# Patient Record
Sex: Female | Born: 1970
Health system: Southern US, Community
[De-identification: ages and names within clinical notes are randomized; demographics above are authoritative.]

## PROBLEM LIST (undated history)

## (undated) DIAGNOSIS — E119 Type 2 diabetes mellitus without complications: Secondary | ICD-10-CM

## (undated) DIAGNOSIS — F419 Anxiety disorder, unspecified: Secondary | ICD-10-CM

## (undated) DIAGNOSIS — Z789 Other specified health status: Secondary | ICD-10-CM

## (undated) DIAGNOSIS — G35 Multiple sclerosis: Secondary | ICD-10-CM

## (undated) DIAGNOSIS — I10 Essential (primary) hypertension: Secondary | ICD-10-CM

## (undated) HISTORY — PX: APPENDECTOMY: SHX54

## (undated) HISTORY — DX: Type 2 diabetes mellitus without complications: E11.9

---

## 1999-06-06 ENCOUNTER — Other Ambulatory Visit: Admission: RE | Admit: 1999-06-06 | Discharge: 1999-06-06 | Payer: Self-pay | Admitting: *Deleted

## 2001-12-21 ENCOUNTER — Encounter: Admission: RE | Admit: 2001-12-21 | Discharge: 2002-03-21 | Payer: Self-pay | Admitting: Internal Medicine

## 2002-05-12 ENCOUNTER — Encounter: Admission: RE | Admit: 2002-05-12 | Discharge: 2002-08-10 | Payer: Self-pay | Admitting: Internal Medicine

## 2002-12-05 ENCOUNTER — Other Ambulatory Visit: Admission: RE | Admit: 2002-12-05 | Discharge: 2002-12-05 | Payer: Self-pay | Admitting: Obstetrics and Gynecology

## 2004-02-08 ENCOUNTER — Other Ambulatory Visit: Admission: RE | Admit: 2004-02-08 | Discharge: 2004-02-08 | Payer: Self-pay | Admitting: Obstetrics and Gynecology

## 2005-07-30 ENCOUNTER — Other Ambulatory Visit: Admission: RE | Admit: 2005-07-30 | Discharge: 2005-07-30 | Payer: Self-pay | Admitting: Obstetrics and Gynecology

## 2006-08-07 ENCOUNTER — Other Ambulatory Visit: Admission: RE | Admit: 2006-08-07 | Discharge: 2006-08-07 | Payer: Self-pay | Admitting: Obstetrics & Gynecology

## 2007-02-04 ENCOUNTER — Other Ambulatory Visit: Admission: RE | Admit: 2007-02-04 | Discharge: 2007-02-04 | Payer: Self-pay | Admitting: Obstetrics and Gynecology

## 2007-06-23 ENCOUNTER — Inpatient Hospital Stay (HOSPITAL_COMMUNITY): Admission: EM | Admit: 2007-06-23 | Discharge: 2007-06-27 | Payer: Self-pay | Admitting: Emergency Medicine

## 2007-06-23 ENCOUNTER — Encounter (INDEPENDENT_AMBULATORY_CARE_PROVIDER_SITE_OTHER): Payer: Self-pay | Admitting: Surgery

## 2009-01-31 ENCOUNTER — Inpatient Hospital Stay (HOSPITAL_COMMUNITY): Admission: AD | Admit: 2009-01-31 | Discharge: 2009-02-06 | Payer: Self-pay | Admitting: Obstetrics and Gynecology

## 2009-02-02 ENCOUNTER — Encounter (INDEPENDENT_AMBULATORY_CARE_PROVIDER_SITE_OTHER): Payer: Self-pay | Admitting: Obstetrics and Gynecology

## 2010-05-09 ENCOUNTER — Encounter: Admission: RE | Admit: 2010-05-09 | Discharge: 2010-05-09 | Payer: Self-pay | Admitting: Internal Medicine

## 2011-01-07 LAB — CBC
HCT: 38.8 % (ref 36.0–46.0)
Hemoglobin: 10.3 g/dL — ABNORMAL LOW (ref 12.0–15.0)
MCHC: 34.8 g/dL (ref 30.0–36.0)
MCV: 84.5 fL (ref 78.0–100.0)
RBC: 3.43 MIL/uL — ABNORMAL LOW (ref 3.87–5.11)
RDW: 14.2 % (ref 11.5–15.5)
WBC: 5.8 10*3/uL (ref 4.0–10.5)

## 2011-02-11 NOTE — Op Note (Signed)
NAMEROSARIA, KUBIN            ACCOUNT NO.:  [id number]   MEDICAL RECORD NO.:  [id number]          PATIENT TYPE:  INP   LOCATION:  1329                         FACILITY:  [facility]   PHYSICIAN:  Thornton Park. Daphine Deutscher, MD  DATE OF BIRTH:  [birth year]   DATE OF PROCEDURE:  06/23/2007  DATE OF DISCHARGE:                               OPERATIVE REPORT   PREOPERATIVE DIAGNOSIS:  Ruptured appendix, peritonitis.   POSTOPERATIVE DIAGNOSIS:  Ruptured appendix, peritonitis.   PROCEDURE:  Laparoscopic appendectomy.   SURGEON:  Thornton Park. Daphine Deutscher, MD   ANESTHESIA:  General endotracheal.   DESCRIPTION OF PROCEDURE:  Ms. Mankowski was taken to room 1 at [facility] on June 23, 2007 and given general anesthesia.  Preoperatively  she received 3 grams of unison.  After the abdomen was prepped with  Techni-Care and draped sterilely, I made a longitudinal incision down  into her very obese umbilicus and entered the abdomen without  difficulty.  Hasson cannula was placed and then a 5 mm was placed the  right upper quadrant and 11 obliquely in the left lower quadrant.   I was struck by the amount of yellow fluid that was socked into the  pelvis and along the right gutter.  I sucked this out with an irrigation  suction device and then used blunt dissection to free up the tip.  This  large phlegmon which appeared to be a perforated appendix that only  partially walled and again with more pus than feculent contamination.  I  did not really see much in the way of feculent contamination.  I was  able to isolate the base the appendix and stapled across with it with  two applications of a vascular stapler and then came to mesentery of the  appendix putting clips on the artery of the appendix and using a  harmonic scalpel to divide it.  Placed in a bag brought to the  umbilicus.  I irrigated the area well and no bleeding was seen.  Everything looked to be in order.  I irrigated the pelvis and removed  the  irrigant and I removed the irrigant from the right upper quadrant.   Then I repaired the umbilical defect with a 30 degree scope under direct  vision with a laparoscope.  Two sutures of 0 Vicryl.  I then injected  all the ports with 0.5% Marcaine closed the skin with 4-0 Vicryl,  Benzoin and Steri-Strips.  The patient tolerated procedure well, was  taken to recovery in room satisfactory condition.      Thornton Park Daphine Deutscher, MD  Electronically Signed    MBM/MEDQ  D:  06/23/2007  T:  06/24/2007  Job:  [id number]   cc:   M. Leda Quail, MD  Fax: [phone]

## 2011-02-11 NOTE — Op Note (Signed)
[name], Ann [name] NO.:  [id number]   MEDICAL RECORD NO.:  [id number]           PATIENT TYPE:   LOCATION:                                 FACILITY:   PHYSICIAN:  Ann [name], M.D.   DATE OF BIRTH:  August 08, 1971   DATE OF PROCEDURE:  02/02/2009  DATE OF DISCHARGE:                               OPERATIVE REPORT   PREOPERATIVE DIAGNOSES:  1. Intrauterine gestation at 40 plus 6 weeks.  2. Fetal intolerance of labor.  3. Positive group B strep status.  4. Large for gestational age.   POSTOPERATIVE DIAGNOSES:  1. Intrauterine gestation at 44 plus 6 weeks.  2. Fetal intolerance of labor.  3. Positive group B strep status.  4. Fetal macrosomia.  5. Umbilical hernia.   PROCEDURE:  Primary low-segment transverse cesarean section with closure  of umbilical hernia.   SURGEON:  Ann Lobo, MD   ASSISTANT:  Gretchen Short, PA-C   ANESTHESIA:  Epidural.   IV FLUIDS:  2300 mL Ringer's lactate.   ESTIMATED BLOOD LOSS:  700 mL.   URINE OUTPUT:  200 mL.   COMPLICATIONS:  None.   INDICATIONS FOR PROCEDURE:  The patient is a 40 year old gravida 1, para  0, African American female who at term was noted to have a large for  gestational age infant weighing 4054 g on office ultrasound.  The  patient had an elevated 1-hour glucose tolerance test but a normal 3-  hour glucose tolerance test.  The patient's BMI, during the pregnancy,  was noted to be 46.  The patient had a positive group B strep status.  Her cervix was noted to be close thick and with the vertex high.  A  discussion was held with the patient regarding options for care and the  patient chose to proceed with an induction of labor after risks,  benefits, and alternatives were reviewed.  The patient was aware that  her cervix was unfavorable and that she did have a risk of cesarean  section and a plan was made to proceed.   The patient was admitted on the evening of Jan 31, 2009, at which time  she  received low-dose Pitocin overnight for cervical ripening.  The  following day, the patient did not exhibit any cervical change and the  Pitocin was therefore discontinued and the patient went on to receive  doses of Cytotec in the evening of Feb 01, 2009.  The fetal heart rate  tracing during this time remained reactive and reassuring until early on  the morning of Feb 02, 2009.   The patient had her Pitocin started early on Feb 02, 2009, and when I  arrived to assume care of the patient, I diagnosed tachy systole and a  nonreactive fetal heart rate tracing with decreased beat-to-beat  variability and mild variables.  The Pitocin was discontinued at this  time and an IV fluid bolus was given for resuscitation.  The fetal heart  rate tracing became reassuring, and the Pitocin was therefore  reinitiated.  The patient became uncomfortable and requesting pain  medication and at this time,  an epidural was given in order to avoid  administering systemic narcotics.  The Pitocin was once again restarted,  and the fetus again developed decreased beat-to-beat variability and  late decelerations along with variable decelerations and an episode of  bradycardia.  The Pitocin was discontinued.  The patient's cervical exam  was reassessed.  There was absolutely no cervical dilation.  The fetal  heart rate tracing did become reactive when the Pitocin was  discontinued, and the patient was given a diagnosis of fetal intolerance  of labor and a large for gestational age infant.   The patient had been receiving penicillin throughout her labor induction  for her positive group B strep status.  There was no sign of  chorioamnionitis at this time.   A discussion was held with the patient regarding her diagnosis and a  recommendation was made to proceed with a primary cesarean section using  a vertical upper abdominal paramedian incision due to her large  panniculus.  Risks, benefits, and alternatives were  reviewed with the  patient who wished to proceed.   FINDINGS:  A viable female was delivered at 73.  Apgars were not noted  in the operating room.  The amniotic fluid was noted to be clear, and  there was a large amount of this.  There was a body cord appreciated.  When the baby was delivered, the baby did appear to be dusky and with  some decreased tone.  The cord pH was later noted to be 7.17 and the  weight was 10 pounds 4.6 ounces.   There were bilateral peritubal adhesions and uterine fundal adhesions to  the omentum.  There was also evidence of an umbilical hernia with no  bowel or omentum herniating into this area.   SPECIMENS:  The placenta was sent to Pathology.   DESCRIPTION OF PROCEDURE:  The patient was escorted from her labor and  delivery suite down to the operating room.  She did receive Ancef for IV  antibiotic prophylaxis.  The patient's epidural was dosed for surgical  anesthesia.   The abdomen was sterilely prepped and draped.  A Foley catheter had been  previously placed.   A right upper abdominal paramedian incision was performed sharply with a  scalpel.  The dissection was carried down to the fascia using monopolar  cautery and a scalpel.  There was significant tissue edema in this  region.  The fascia was then incised vertically with a scalpel.  Entry  into the peritoneal cavity was performed at this time.  The peritoneal  incision was extended cranially and caudally using scissors.   The Alexis retractor was used.  The lower uterine segment was exposed.  A bladder flap was sharply created.  A transverse lower uterine segment  incision was then created with a scalpel.  A hand was inserted through  the uterine incision.  The umbilical cord and the vertex delivered  together with the assistance of uterine fundal pressure.  The remainder  of the newborn was delivered.  The nares and mouth were suctioned.  The  newborn was carried over to the awaiting  pediatricians.  The baby did  require some suctioning.  Ultimately, the baby was transferred to the  neonatal intensive care unit for grunting and retracting.   The placenta was manually extracted at this time after a cord pH and  cord blood were obtained.  The uterus was wiped clean with a moistened  lap pad.  All remaining products of conception  were removed.  The  uterine incision was then closed with a double layer closure of #1  chromic.  The first was a running locked layer and the second was an  imbricating layer.  Hemostasis was noted to be good.  Adhesions between  the omentum and the fallopian tubes and the uterine fundus were lysed  with monopolar cautery.   The abdomen was closed at this time.  It was closed with a mass closure  of double-looped zero PDS.  During the closure of the abdominal wall,  the hernial defect was closed as this was basically just lateral and  very close to the fascial closure itself.   The subcutaneous layer was irrigated and suctioned and made hemostatic  with monopolar cautery.  It was closed with interrupted sutures of 2-0  plain gut suture.  The skin was closed with staples and a sterile  bandage was placed over the incision.   This concluded the patient's procedure.  There were no complications.  All needle, instrument, and sponge counts were correct.      Ann [name], M.D.  Electronically Signed     BES/MEDQ  D:  02/02/2009  T:  02/03/2009  Job:  [id number]

## 2011-02-14 NOTE — Discharge Summary (Signed)
NAMEBRITTYN, Ann [name]            ACCOUNT NO.:  [id number]   MEDICAL RECORD NO.:  [id number]          PATIENT TYPE:  INP   LOCATION:  9302                          FACILITY:  [facility]   PHYSICIAN:  Carrington Clamp, M.D. DATE OF BIRTH:  1970/10/29   DATE OF ADMISSION:  01/31/2009  DATE OF DISCHARGE:  02/06/2009                               DISCHARGE SUMMARY   FINAL DIAGNOSES:  Intrauterine gestation at 39-6/7 weeks' gestation,  induction of labor, fetal intolerance of labor, positive group B  streptococcus, fetal macrosomia, and umbilical hernia.   PROCEDURE:  Primary low transverse cesarean section with closure of an  umbilical hernia.   SURGEON:  Randye Lobo, MD   ASSISTANT:  Garrison Columbus, PA-C   COMPLICATIONS:  None.   This 40 year old G1, P0 presents at term for an induction.  The patient  was noted to have a large for gestational age infant.  On office  ultrasound, the patient had abnormal 1-hour but passed a 3-hour glucose  tolerance test.  The patient was also known to be positive group B strep  status.  The patient does have multiple sclerosis.  She has also  advanced maternal age and did have her quad screen that was within  normal limits, but did not have amniocentesis.  The patient's cervix at  term was still closed and thick and vertex was high.  The patient was  admitted at this time where she received low-dose Pitocin overnight for  cervical ripening.  By the next day, there was still no cervical change.  Pitocin was discontinued, and the patient received Cytotec on the  evening of Feb 01, 2009.  Fetal heart tones during this time were  reactive.  The patient's Pitocin was started on the morning of Feb 02, 2009.  Some tachycardia and nonreactive fetal heart tracing was noted  with decreased beat-to-beat variability.  At this point, Pitocin was  discontinued, IV fluids were started, and the heart rate tracing became  reassuring.  Pitocin was able to be restarted at  this point, and at this  point, the fetus again started to develop some beat-to-beat variability  and late decelerations.  At this point, Pitocin was discontinued.  Cervix was checked, there was no cervical dilation.  At this point, a  discussion was held with the patient regarding the fetal intolerance of  labor and decision was made to proceed with a cesarean section.  The  patient had been receiving penicillin throughout her labor induction for  positive group B strep status.  The patient at this point was taken to  the operating room on Feb 02, 2009, by Dr. Conley Simmonds where a primary  low segment transverse cesarean section was performed with the delivery  of a 10-pound 4.6-ounce female infant.  Upon delivery, there was a body  cord noted.  Baby did appear dusky with some decreased tone.  Cord pH  was noted to be 7.17.  There also were bilateral peritubal adhesions and  uterine fundal adhesions to the omentum, and also advancement of  umbilical hernia, but no bowel herniating into this area.  Umbilical  hernia was repaired at this point.  There were no complications.  The  patient was taken to the recovery room in good condition.   The patient's postoperative course was benign without any significant  fevers.  Baby was in the NICU on room air and doing well.  The patient  was kept in the hospital until postoperative day #4.  She was sent home  on a regular diet, told to decrease her activities, told to continue her  prenatal vitamins, was given a prescription for Percocet 1-2 every 4-6  hours as needed for her pain, told she could use over-the-counter  ibuprofen up to 600 mg every 6 hours as needed for pain, was to follow  up in our office in 2 weeks, and then also for her staple removal on  Friday.  Instructions and precautions were reviewed with the patient.   Labs on discharge, the patient had a hemoglobin of 10.3, white blood  cell count of 6.0, platelets of 129,000.   On the  surgical pathology of the placenta, there was acute funisitis and  early acute chorioamnionitis noted.      Leilani Able, P.A.-C.      Carrington Clamp, M.D.  Electronically Signed    MB/MEDQ  D:  02/21/2009  T:  02/22/2009  Job:  [id number]

## 2011-02-14 NOTE — Discharge Summary (Signed)
[name], [name]            ACCOUNT NO.:  [id number]   MEDICAL RECORD NO.:  [id number]          PATIENT TYPE:  INP   LOCATION:  1329                         [facility]ACILITY:  [facility]   PHYSICIAN:  Thornton Park. Daphine Deutscher, MD  DATE OF BIRTH:  [birth year]   DATE OF ADMISSION:  06/23/2007  DATE OF DISCHARGE:  06/27/2007                               DISCHARGE SUMMARY   ADMITTING DIAGNOSIS:  Appendicitis with ruptured appendix and phlegmon.   COURSE IN THE HOSPITAL:  This patient came over from Dr. Mariane Masters  office, who had a CT scan at [facility] that was consistent with a  ruptured appendix and phlegmon.  She is a 40 year old obese black  female; and after getting informed consent regarding laparoscopic as  well as open appendectomy, she was taken to the operating room and  underwent a laparoscopic appendectomy.  She did well postoperative  initially, although she has having a fair amount of pain in her  umbilicus and had minimal mobility.  She was not coughing or deep  breathing on the first postoperative day.   On the second postoperative day she had a temperature of 101.9,  which  postponed her discharge.  We kept her on IV Unasyn and observed her.  She finally got better and was ready for discharge.   On postoperative day #4 her white count was down to 8000 and she was  afebrile.  She was made ready to go home and was given Augmentin 875 and  Vicodin.  We have asked her to come back to the office and see Dr.  Daphine Deutscher in 2-3 weeks.   FINAL DIAGNOSES:  Acute suppurative appendicitis with fibrous subliminal  obliteration at the tip.      Thornton Park Daphine Deutscher, MD  Electronically Signed     MBM/MEDQ  D:  07/17/2007  T:  07/19/2007  Job:  [id number]

## 2011-07-10 LAB — CBC
HCT: 35.7 — ABNORMAL LOW
Hemoglobin: 10.8 — ABNORMAL LOW
Hemoglobin: 11.4 — ABNORMAL LOW
Hemoglobin: 12
Hemoglobin: 14.2
MCHC: 34.3
MCHC: 34.6
MCHC: 35
MCV: 80.4
MCV: 80.5
MCV: 80.9
Platelets: 222
Platelets: 240
Platelets: 245
RBC: 3.86 — ABNORMAL LOW
RBC: 4.08
RBC: 4.41
RDW: 13.4
RDW: 13.6
RDW: 14
RDW: 14.1 — ABNORMAL HIGH
WBC: 12.4 — ABNORMAL HIGH
WBC: 15.2 — ABNORMAL HIGH

## 2011-07-10 LAB — BASIC METABOLIC PANEL
CO2: 25
Calcium: 8 — ABNORMAL LOW
Calcium: 9
Chloride: 100
Chloride: 103
Chloride: 99
Creatinine, Ser: 0.97
GFR calc Af Amer: >60
GFR calc Af Amer: >60
GFR calc non Af Amer: 59 — ABNORMAL LOW
GFR calc non Af Amer: >60
GFR calc non Af Amer: >60
Glucose, Bld: 104 — ABNORMAL HIGH
Glucose, Bld: 106 — ABNORMAL HIGH
Glucose, Bld: 112 — ABNORMAL HIGH
Glucose, Bld: 118 — ABNORMAL HIGH
Glucose, Bld: 92
Potassium: 3.6
Potassium: 3.6
Sodium: 134 — ABNORMAL LOW
Sodium: 137

## 2011-07-10 LAB — DIFFERENTIAL
Band Neutrophils: 0
Basophils Relative: 0
Basophils Relative: 0
Eosinophils Relative: 0
Eosinophils Relative: 1
Lymphocytes Relative: 6 — ABNORMAL LOW
Lymphs Abs: 0.8
Monocytes Absolute: 0.6
Monocytes Relative: 0 — ABNORMAL LOW
Monocytes Relative: 5
Myelocytes: 0
Neutro Abs: 11 — ABNORMAL HIGH
Promyelocytes Absolute: 0

## 2011-09-10 ENCOUNTER — Other Ambulatory Visit: Payer: Self-pay | Admitting: Obstetrics and Gynecology

## 2011-09-15 ENCOUNTER — Other Ambulatory Visit: Payer: Self-pay | Admitting: Obstetrics and Gynecology

## 2011-09-15 DIAGNOSIS — R928 Other abnormal and inconclusive findings on diagnostic imaging of breast: Secondary | ICD-10-CM

## 2011-09-29 ENCOUNTER — Ambulatory Visit
Admission: RE | Admit: 2011-09-29 | Discharge: 2011-09-29 | Disposition: A | Discharge status: Home / Self Care | Payer: 59 | Source: Ambulatory Visit | Attending: Obstetrics and Gynecology | Admitting: Obstetrics and Gynecology

## 2011-09-29 DIAGNOSIS — R928 Other abnormal and inconclusive findings on diagnostic imaging of breast: Secondary | ICD-10-CM

## 2013-12-16 ENCOUNTER — Other Ambulatory Visit: Payer: Self-pay | Admitting: Obstetrics and Gynecology

## 2015-07-19 ENCOUNTER — Encounter (HOSPITAL_COMMUNITY): Payer: Self-pay | Admitting: Neurology

## 2015-07-19 ENCOUNTER — Emergency Department (HOSPITAL_COMMUNITY): Payer: 59

## 2015-07-19 ENCOUNTER — Emergency Department (HOSPITAL_COMMUNITY)
Admission: EM | Admit: 2015-07-19 | Discharge: 2015-07-19 | Disposition: A | Discharge status: Home / Self Care | Payer: 59 | Attending: Emergency Medicine | Admitting: Emergency Medicine

## 2015-07-19 DIAGNOSIS — Z79899 Other long term (current) drug therapy: Secondary | ICD-10-CM | POA: Diagnosis not present

## 2015-07-19 DIAGNOSIS — J36 Peritonsillar abscess: Secondary | ICD-10-CM | POA: Diagnosis not present

## 2015-07-19 DIAGNOSIS — R110 Nausea: Secondary | ICD-10-CM | POA: Insufficient documentation

## 2015-07-19 DIAGNOSIS — J029 Acute pharyngitis, unspecified: Secondary | ICD-10-CM | POA: Diagnosis present

## 2015-07-19 DIAGNOSIS — Z792 Long term (current) use of antibiotics: Secondary | ICD-10-CM | POA: Diagnosis not present

## 2015-07-19 DIAGNOSIS — E119 Type 2 diabetes mellitus without complications: Secondary | ICD-10-CM | POA: Diagnosis not present

## 2015-07-19 HISTORY — DX: Type 2 diabetes mellitus without complications: E11.9

## 2015-07-19 LAB — CBC WITH DIFFERENTIAL/PLATELET
Basophils Absolute: 0 K/uL (ref 0.0–0.1)
Basophils Relative: 0 %
Eosinophils Absolute: 0 K/uL (ref 0.0–0.7)
Eosinophils Relative: 0 %
HCT: 42.8 % (ref 36.0–46.0)
Hemoglobin: 14.3 g/dL (ref 12.0–15.0)
Lymphocytes Relative: 10 %
Lymphs Abs: 1.2 K/uL (ref 0.7–4.0)
MCH: 26.9 pg (ref 26.0–34.0)
MCHC: 33.4 g/dL (ref 30.0–36.0)
MCV: 80.6 fL (ref 78.0–100.0)
Monocytes Absolute: 0.8 K/uL (ref 0.1–1.0)
Monocytes Relative: 6 %
Neutro Abs: 10.5 K/uL — ABNORMAL HIGH (ref 1.7–7.7)
Neutrophils Relative %: 84 %
Platelets: 225 K/uL (ref 150–400)
RBC: 5.31 MIL/uL — ABNORMAL HIGH (ref 3.87–5.11)
RDW: 14.3 % (ref 11.5–15.5)
WBC: 12.5 K/uL — ABNORMAL HIGH (ref 4.0–10.5)

## 2015-07-19 LAB — BASIC METABOLIC PANEL WITH GFR
Anion gap: 11 (ref 5–15)
BUN: 5 mg/dL — ABNORMAL LOW (ref 6–20)
CO2: 24 mmol/L (ref 22–32)
Calcium: 9.2 mg/dL (ref 8.9–10.3)
Chloride: 104 mmol/L (ref 101–111)
Creatinine, Ser: 0.92 mg/dL (ref 0.44–1.00)
GFR calc Af Amer: >60 mL/min
GFR calc non Af Amer: >60 mL/min
Glucose, Bld: 132 mg/dL — ABNORMAL HIGH (ref 65–99)
Potassium: 3.7 mmol/L (ref 3.5–5.1)
Sodium: 139 mmol/L (ref 135–145)

## 2015-07-19 LAB — RAPID STREP SCREEN (MED CTR MEBANE ONLY): Streptococcus, Group A Screen (Direct): NEGATIVE

## 2015-07-19 MED ORDER — CLINDAMYCIN PHOSPHATE 600 MG/50ML IV SOLN
600.0000 mg | Freq: Once | INTRAVENOUS | Status: AC
  Administered 2015-07-19: 600 mg via INTRAVENOUS
  Filled 2015-07-19: qty 50

## 2015-07-19 MED ORDER — DEXAMETHASONE SODIUM PHOSPHATE 10 MG/ML IJ SOLN
10.0000 mg | Freq: Once | INTRAMUSCULAR | Status: AC
  Administered 2015-07-19: 10 mg via INTRAVENOUS
  Filled 2015-07-19: qty 1

## 2015-07-19 MED ORDER — MORPHINE SULFATE (PF) 4 MG/ML IV SOLN
4.0000 mg | Freq: Once | INTRAVENOUS | Status: AC
  Administered 2015-07-19: 4 mg via INTRAVENOUS
  Filled 2015-07-19: qty 1

## 2015-07-19 MED ORDER — CLINDAMYCIN PALMITATE HCL 75 MG/5ML PO SOLR: 300.0000 mg | Freq: Three times a day (TID) | ORAL | Status: DC

## 2015-07-19 MED ORDER — IOHEXOL 300 MG/ML  SOLN
75.0000 mL | Freq: Once | INTRAMUSCULAR | Status: AC | PRN
  Administered 2015-07-19: 75 mL via INTRAVENOUS

## 2015-07-19 MED ORDER — PREDNISOLONE SODIUM PHOSPHATE 15 MG/5ML PO SOLN: 10.0000 mg | Freq: Every day | ORAL | Status: DC

## 2015-07-19 NOTE — ED Notes (Signed)
Pt here from home today with c/o sore throat , pt throat is red painful to swallow and neck in sore to palpation pt was started on z pac yesterday by primary MD

## 2015-07-19 NOTE — Discharge Instructions (Signed)
Pharyngitis Pharyngitis is redness, pain, and swelling (inflammation) of your pharynx.  CAUSES  Pharyngitis is usually caused by infection. Most of the time, these infections are from viruses (viral) and are part of a cold. However, sometimes pharyngitis is caused by bacteria (bacterial). Pharyngitis can also be caused by allergies. Viral pharyngitis may be spread from person to person by coughing, sneezing, and personal items or utensils (cups, forks, spoons, toothbrushes). Bacterial pharyngitis may be spread from person to person by more intimate contact, such as kissing.  SIGNS AND SYMPTOMS  Symptoms of pharyngitis include:   Sore throat.   Tiredness (fatigue).   Low-grade fever.   Headache.  Joint pain and muscle aches.  Skin rashes.  Swollen lymph nodes.  Plaque-like film on throat or tonsils (often seen with bacterial pharyngitis). DIAGNOSIS  Your health care provider will ask you questions about your illness and your symptoms. Your medical history, along with a physical exam, is often all that is needed to diagnose pharyngitis. Sometimes, a rapid strep test is done. Other lab tests may also be done, depending on the suspected cause.  TREATMENT  Viral pharyngitis will usually get better in 3-4 days without the use of medicine. Bacterial pharyngitis is treated with medicines that kill germs (antibiotics).  HOME CARE INSTRUCTIONS   Drink enough water and fluids to keep your urine clear or pale yellow.   Only take over-the-counter or prescription medicines as directed by your health care provider:   If you are prescribed antibiotics, make sure you finish them even if you start to feel better.   Do not take aspirin.   Get lots of rest.   Gargle with 8 oz of salt water ( tsp of salt per 1 qt of water) as often as every 1-2 hours to soothe your throat.   Throat lozenges (if you are not at risk for choking) or sprays may be used to soothe your throat. SEEK MEDICAL  CARE IF:   You have large, tender lumps in your neck.  You have a rash.  You cough up green, yellow-brown, or bloody spit. SEEK IMMEDIATE MEDICAL CARE IF:   Your neck becomes stiff.  You drool or are unable to swallow liquids.  You vomit or are unable to keep medicines or liquids down.  You have severe pain that does not go away with the use of recommended medicines.  You have trouble breathing (not caused by a stuffy nose). MAKE SURE YOU:   Understand these instructions.  Will watch your condition.  Will get help right away if you are not doing well or get worse.   This information is not intended to replace advice given to you by your health care provider. Make sure you discuss any questions you have with your health care provider.   Document Released: 09/15/2005 Document Revised: 07/06/2013 Document Reviewed: 05/23/2013 Elsevier Interactive Patient Education 2016 [employer]  Sore Throat A sore throat is pain, burning, irritation, or scratchiness of the throat. There is often pain or tenderness when swallowing or talking. A sore throat may be accompanied by other symptoms, such as coughing, sneezing, fever, and swollen neck glands. A sore throat is often the first sign of another sickness, such as a cold, flu, strep throat, or mononucleosis (commonly known as mono). Most sore throats go away without medical treatment. CAUSES  The most common causes of a sore throat include:  A viral infection, such as a cold, flu, or mono.  A bacterial infection, such as strep throat,  tonsillitis, or whooping cough.  Seasonal allergies.  Dryness in the air.  Irritants, such as smoke or pollution.  Gastroesophageal reflux disease (GERD). HOME CARE INSTRUCTIONS   Only take over-the-counter medicines as directed by your caregiver.  Drink enough fluids to keep your urine clear or pale yellow.  Rest as needed.  Try using throat sprays, lozenges, or sucking on hard candy to ease  any pain (if older than 4 years or as directed).  Sip warm liquids, such as broth, herbal tea, or warm water with honey to relieve pain temporarily. You may also eat or drink cold or frozen liquids such as frozen ice pops.  Gargle with salt water (mix 1 tsp salt with 8 oz of water).  Do not smoke and avoid secondhand smoke.  Put a cool-mist humidifier in your bedroom at night to moisten the air. You can also turn on a hot shower and sit in the bathroom with the door closed for 5-10 minutes. SEEK IMMEDIATE MEDICAL CARE IF:  You have difficulty breathing.  You are unable to swallow fluids, soft foods, or your saliva.  You have increased swelling in the throat.  Your sore throat does not get better in 7 days.  You have nausea and vomiting.  You have a fever or persistent symptoms for more than 2-3 days.  You have a fever and your symptoms suddenly get worse. MAKE SURE YOU:   Understand these instructions.  Will watch your condition.  Will get help right away if you are not doing well or get worse.   This information is not intended to replace advice given to you by your health care provider. Make sure you discuss any questions you have with your health care provider.    Take antibiotics as prescribed. Take 49ml prednisolone for 2 days, then 17 ml prednisolone x 2 days, then 14 ml x 2 days, 35ml x 2 days, 40ml x 2 days, 60ml x 2 days.   Follow up with ENT if symptoms do not improve within 1 week. Return to the ED if you experience worsening of your symptoms, fever, difficulty breathing or swallowing.

## 2015-07-20 NOTE — ED Provider Notes (Signed)
CSN: [id number]     Arrival date & time 07/19/15  1020 History   First MD Initiated Contact with Patient 07/19/15 1117     Chief Complaint  Patient presents with  . Sore Throat     (Consider location/radiation/quality/duration/timing/severity/associated sxs/prior Treatment) HPI   Ann [name] is a 44 y.o F with a pmhx of DM who presents to the emergency department today complaining of sore throat and difficulty swallowing. Patient states that she has been having a progressively worsening sore throat over the last week. Now it is painful to swallow as well as painful to talk. Patient was seen in her primary care office yesterday and given a Z-Pak. Patient states that strep test was not performed on her yesterday at her PCP office. However patient is not improving symptomatically. Now complaining of fever, chills, nausea. Denies shortness of breath, chest pain, headache, cough.  Past Medical History  Diagnosis Date  . Diabetes mellitus without complication [facility])    Past Surgical History  Procedure Laterality Date  . Appendectomy    . Cesarean section     History reviewed. No pertinent family history. Social History  Substance Use Topics  . Smoking status: Never Smoker   . Smokeless tobacco: None  . Alcohol Use: No   OB History    No data available     Review of Systems  All other systems reviewed and are negative.     Allergies  Vicodin  Home Medications   Prior to Admission medications   Medication Sig Start Date End Date Taking? Authorizing Provider  azithromycin (ZITHROMAX) 250 MG tablet Take 250-500 mg by mouth daily. 500 mg for day 1 then 250 mg for 4 days   Yes Historical Provider, MD  metFORMIN (GLUCOPHAGE) 500 MG tablet Take 500 mg by mouth 2 (two) times daily with a meal.   Yes Historical Provider, MD  norethindrone (ORTHO MICRONOR) 0.35 MG tablet Take 1 tablet by mouth daily.   Yes Historical Provider, MD  clindamycin (CLEOCIN) 75 MG/5ML solution Take 20  mLs (300 mg total) by mouth 3 (three) times daily. 07/19/15   Samantha Tripp Dowless, PA-C  prednisoLONE (ORAPRED) 15 MG/5ML solution Take 3.3 mLs (10 mg total) by mouth daily before breakfast. 07/19/15   Samantha Tripp Dowless, PA-C   BP 142/87 mmHg  Pulse 98  Temp(Src) 98 F (36.7 C) (Oral)  Resp 16  Ht 5\' 5"  (1.651 m)  Wt 333 lb (151.048 kg)  BMI 55.41 kg/m2  SpO2 98% Physical Exam  Constitutional: She is oriented to person, place, and time. She appears well-developed and well-nourished. No distress.  HENT:  Head: Normocephalic and atraumatic.  Right Ear: External ear normal.  Left Ear: External ear normal.  Mouth/Throat: Uvula is midline. Posterior oropharyngeal edema, posterior oropharyngeal erythema and tonsillar abscesses ( bilateral tonsilar abscesses) present. No oropharyngeal exudate.    Eyes: Conjunctivae and EOM are normal. Pupils are equal, round, and reactive to light. Right eye exhibits no discharge. Left eye exhibits no discharge. No scleral icterus.  Cardiovascular: Normal rate, regular rhythm, normal heart sounds and intact distal pulses.  Exam reveals no gallop and no friction rub.   No murmur heard. Pulmonary/Chest: Effort normal and breath sounds normal. No respiratory distress. She has no wheezes. She has no rales. She exhibits no tenderness.  Abdominal: Soft. She exhibits no distension and no mass. There is no tenderness. There is no rebound and no guarding.  Musculoskeletal: Normal range of motion. She exhibits no edema or tenderness.  Neurological: She is alert and oriented to person, place, and time.  Strength 5/5 throughout. No sensory deficits.  No gait abnormality  Skin: Skin is warm and dry. No rash noted. She is not diaphoretic. No erythema. No pallor.  Psychiatric: She has a normal mood and affect. Her behavior is normal.  Nursing note and vitals reviewed.   ED Course  Procedures (including critical care time) Labs Review Labs Reviewed  BASIC  METABOLIC PANEL - Abnormal; Notable for the following:    Glucose, Bld 132 (*)    BUN 5 (*)    All other components within normal limits  CBC WITH DIFFERENTIAL/PLATELET - Abnormal; Notable for the following:    WBC 12.5 (*)    RBC 5.31 (*)    Neutro Abs 10.5 (*)    All other components within normal limits  RAPID STREP SCREEN (NOT AT [facility])  CULTURE, GROUP A STREP    Imaging Review Ct Soft Tissue Neck W Contrast  07/19/2015  CLINICAL DATA:  Difficulty speaking and swelling. Evaluate for peritonsillar abscess. EXAM: CT NECK WITH CONTRAST TECHNIQUE: Multidetector CT imaging of the neck was performed using the standard protocol following the bolus administration of intravenous contrast. CONTRAST:  60mL OMNIPAQUE IOHEXOL 300 MG/ML  SOLN COMPARISON:  None. FINDINGS: Pharynx and larynx: There is marked thickening and avid enhancement of the tonsillar tissue, especially the palatine tonsils which touch in the midline and have pus in the crypts. There is a 7 mm collection at the base of the right tonsil with other smaller surrounding areas of cavitation or purulent collection. Cystic changes also in the lower left palatini tonsil measuring no more than 5 mm. The pharynx is diffusely avidly enhancing with peripharyngeal (including retropharyngeal) edema. There is no very pharyngeal phlegmon or abscess. Bilateral cervical adenitis without suppurative changes. Salivary glands: Negative Thyroid: Negative Lymph nodes: Reactive adenitis as above. Vascular: No venous occlusion. Limited intracranial: Negative Visualized orbits: Not visualized Skeleton: Negative Upper chest: No apical pneumonia IMPRESSION: Tonsillitis and pharyngitis with bilateral palatine tonsil early abscesses up to 7 mm on the right. Retropharyngeal edema without retropharyngeal abscess. Non suppurative adenitis. Electronically Signed   By: Monte Fantasia M.D.   On: 07/19/2015 13:51   I have personally reviewed and evaluated these images and  lab results as part of my medical decision-making.   EKG Interpretation None      MDM   Final diagnoses:  Acute pharyngitis, unspecified etiology  Tonsil, abscess    Pt presents with sore throat, dysphagia, odynophagia present for 1 week. Afebrile. Decreased by mouth intake due to difficulty swallowing. On exam tonsils are large, erythematous and touching. Appeared to be abscess. Patient given 600 mg IV clindamycin and 20 mg IV Decadron as well as morphine. Patient states symptomatically improved after intervention.  W BC 12.5. CT neck reveals tonsillitis and pharyngitis with bilateral palatine tonsil early abscesses up to 7 mm.  Spoke with Dr. Simeon Craft ENT who states that these abscesses may possibly be hypodensities within the tonsils. Suspects that this is just severe tonsillitis. Recommends prescription liquid clindamycin and liquid prednisolone. Patient may follow up with Dr. Simeon Craft in his office if her symptoms do not improve. Discussed this treatment plan with patient who is agreeable.  Vital signs stable. Patient stable for discharge return precautions outlined in patient discharge instructions.    Dondra Spry [place], PA-C 07/20/15 1132  Lacretia Leigh, MD 07/31/15 [phone]

## 2015-07-21 LAB — CULTURE, GROUP A STREP: Strep A Culture: POSITIVE — AB

## 2015-07-22 ENCOUNTER — Telehealth (HOSPITAL_COMMUNITY): Payer: Self-pay

## 2015-07-22 NOTE — Telephone Encounter (Signed)
Post ED Visit - Positive Culture Follow-up  Culture report reviewed by antimicrobial stewardship pharmacist:  []  Heide Guile, Pharm.D., BCPS []  Alycia Rossetti, Pharm.D., BCPS []  [place], Pharm.D., BCPS, AAHIVP []  Legrand Como, Pharm.D., BCPS, AAHIVP []  [place], Pharm.D. [x]  Milus Glazier, [place].D.  Positive strep culture Treated with azithromycin, organism sensitive to the same and no further patient follow-up is required at this time.  Ileene Musa 07/22/2015, 12:04 PM

## 2016-01-29 ENCOUNTER — Other Ambulatory Visit: Payer: Self-pay | Admitting: Orthopaedic Surgery

## 2016-01-29 DIAGNOSIS — M25562 Pain in left knee: Secondary | ICD-10-CM

## 2016-02-01 ENCOUNTER — Ambulatory Visit
Admission: RE | Admit: 2016-02-01 | Discharge: 2016-02-01 | Disposition: A | Discharge status: Home / Self Care | Payer: 59 | Source: Ambulatory Visit | Attending: Orthopaedic Surgery | Admitting: Orthopaedic Surgery

## 2016-02-01 DIAGNOSIS — M25562 Pain in left knee: Secondary | ICD-10-CM

## 2016-02-28 ENCOUNTER — Emergency Department (HOSPITAL_COMMUNITY)
Admission: EM | Admit: 2016-02-28 | Discharge: 2016-02-28 | Disposition: A | Discharge status: Home / Self Care | Payer: 59 | Attending: Emergency Medicine | Admitting: Emergency Medicine

## 2016-02-28 ENCOUNTER — Encounter (HOSPITAL_COMMUNITY): Payer: Self-pay | Admitting: Emergency Medicine

## 2016-02-28 DIAGNOSIS — R0981 Nasal congestion: Secondary | ICD-10-CM | POA: Insufficient documentation

## 2016-02-28 DIAGNOSIS — E119 Type 2 diabetes mellitus without complications: Secondary | ICD-10-CM | POA: Insufficient documentation

## 2016-02-28 DIAGNOSIS — Z7984 Long term (current) use of oral hypoglycemic drugs: Secondary | ICD-10-CM | POA: Insufficient documentation

## 2016-02-28 DIAGNOSIS — R42 Dizziness and giddiness: Secondary | ICD-10-CM

## 2016-02-28 DIAGNOSIS — E860 Dehydration: Secondary | ICD-10-CM | POA: Diagnosis not present

## 2016-02-28 LAB — COMPREHENSIVE METABOLIC PANEL
ALK PHOS: 74 U/L (ref 38–126)
ALT: 19 U/L (ref 14–54)
ANION GAP: 9 (ref 5–15)
AST: 18 U/L (ref 15–41)
Albumin: 3.8 g/dL (ref 3.5–5.0)
BUN: 9 mg/dL (ref 6–20)
CALCIUM: 8.9 mg/dL (ref 8.9–10.3)
CHLORIDE: 102 mmol/L (ref 101–111)
CO2: 25 mmol/L (ref 22–32)
Creatinine, Ser: 0.93 mg/dL (ref 0.44–1.00)
GFR calc non Af Amer: >60 mL/min (ref >60)
Glucose, Bld: 110 mg/dL — ABNORMAL HIGH (ref 65–99)
POTASSIUM: 4.1 mmol/L (ref 3.5–5.1)
Sodium: 136 mmol/L (ref 135–145)
Total Bilirubin: 1 mg/dL (ref 0.3–1.2)
Total Protein: 7.7 g/dL (ref 6.5–8.1)

## 2016-02-28 LAB — CBC WITH DIFFERENTIAL/PLATELET
Basophils Absolute: 0 10*3/uL (ref 0.0–0.1)
Basophils Relative: 0 %
EOS ABS: 0.1 10*3/uL (ref 0.0–0.7)
EOS PCT: 2 %
HCT: 42 % (ref 36.0–46.0)
Hemoglobin: 13.9 g/dL (ref 12.0–15.0)
LYMPHS ABS: 1.6 10*3/uL (ref 0.7–4.0)
Lymphocytes Relative: 23 %
MCH: 26.8 pg (ref 26.0–34.0)
MCHC: 33.1 g/dL (ref 30.0–36.0)
MCV: 81.1 fL (ref 78.0–100.0)
MONOS PCT: 7 %
Monocytes Absolute: 0.5 10*3/uL (ref 0.1–1.0)
Neutro Abs: 4.7 10*3/uL (ref 1.7–7.7)
Neutrophils Relative %: 68 %
PLATELETS: 297 10*3/uL (ref 150–400)
RBC: 5.18 MIL/uL — ABNORMAL HIGH (ref 3.87–5.11)
RDW: 14.2 % (ref 11.5–15.5)
WBC: 6.9 10*3/uL (ref 4.0–10.5)

## 2016-02-28 LAB — URINALYSIS, DIPSTICK ONLY
Bilirubin Urine: NEGATIVE
Glucose, UA: NEGATIVE mg/dL
HGB URINE DIPSTICK: NEGATIVE
Ketones, ur: NEGATIVE mg/dL
Leukocytes, UA: NEGATIVE
Nitrite: NEGATIVE
PROTEIN: NEGATIVE mg/dL
Specific Gravity, Urine: 1.008 (ref 1.005–1.030)
pH: 6 (ref 5.0–8.0)

## 2016-02-28 LAB — I-STAT VENOUS BLOOD GAS, ED
ACID-BASE DEFICIT: 1 mmol/L (ref 0.0–2.0)
Bicarbonate: 24.6 mEq/L — ABNORMAL HIGH (ref 20.0–24.0)
O2 Saturation: 91 %
PO2 VEN: 61 mmHg — AB (ref 31.0–45.0)
TCO2: 26 mmol/L (ref 0–100)
pCO2, Ven: 41.7 mmHg — ABNORMAL LOW (ref 45.0–50.0)
pH, Ven: 7.38 — ABNORMAL HIGH (ref 7.250–7.300)

## 2016-02-28 LAB — D-DIMER, QUANTITATIVE (NOT AT ARMC)

## 2016-02-28 LAB — MAGNESIUM: Magnesium: 1.9 mg/dL (ref 1.7–2.4)

## 2016-02-28 LAB — POC URINE PREG, ED: PREG TEST UR: NEGATIVE

## 2016-02-28 LAB — CBG MONITORING, ED: GLUCOSE-CAPILLARY: 99 mg/dL (ref 65–99)

## 2016-02-28 MED ORDER — SODIUM CHLORIDE 0.9 % IV BOLUS (SEPSIS)
1000.0000 mL | Freq: Once | INTRAVENOUS | Status: AC
  Administered 2016-02-28: 1000 mL via INTRAVENOUS

## 2016-02-28 MED ORDER — IOPAMIDOL (ISOVUE-370) INJECTION 76%
INTRAVENOUS | Status: AC
  Filled 2016-02-28: qty 100

## 2016-02-28 NOTE — ED Notes (Signed)
Patient verbalized understanding of discharge instructions and denies any further needs or questions at this time. VS stable. Patient ambulatory with steady gait, assisted to ED entrance in wheelchair.  

## 2016-02-28 NOTE — ED Notes (Signed)
Dr. Smith at bedside.

## 2016-02-28 NOTE — ED Provider Notes (Signed)
CSN: [id number]     Arrival date & time 02/28/16  1602 History   None    Chief Complaint  Patient presents with  . Dizziness    Patient is a 45 y.o. female presenting with dizziness. The history is provided by the patient and medical records. No language interpreter was used.  Dizziness Quality:  Lightheadedness Severity:  Moderate Onset quality:  Gradual Duration:  2 days Timing:  Constant Progression:  Worsening Chronicity:  New Context: not with head movement and not with loss of consciousness   Context comment:  Onset while walking yesterday, but not significantly exerting herself. When symptoms worsened today checked blood glucose and was 122. Recent URI last week with congestion, cough, subjective fever . Relieved by:  Nothing Worsened by:  Nothing Ineffective treatments:  Fluids Associated symptoms: palpitations   Associated symptoms: no blood in stool, no diarrhea, no headaches, no nausea, no shortness of breath, no vomiting and no weakness   Risk factors: new medications (started metformin for diabetes recently)   Risk factors: no hx of vertigo   Risk factors comment:  +obestity, +OCP use. Denies hx smoking, recent travel, calf tenderness/swelling, or history of bleeding or clotting problems.   Past Medical History  Diagnosis Date  . Diabetes mellitus without complication [facility])    Past Surgical History  Procedure Laterality Date  . Appendectomy    . Cesarean section     No family history on file. Social History  Substance Use Topics  . Smoking status: Never Smoker   . Smokeless tobacco: Not on file  . Alcohol Use: No   OB History    No data available     Review of Systems  Constitutional: Negative for fever and chills.  HENT: Positive for congestion. Negative for rhinorrhea and sinus pressure.   Eyes: Negative for visual disturbance.  Respiratory: Negative for shortness of breath.   Cardiovascular: Positive for palpitations. Negative for leg swelling.   Gastrointestinal: Negative for nausea, vomiting, diarrhea and blood in stool.  Genitourinary: Negative for hematuria and difficulty urinating.  Musculoskeletal: Negative for back pain and neck pain.  Skin: Negative for pallor and rash.  Neurological: Positive for dizziness and light-headedness. Negative for weakness and headaches.  Psychiatric/Behavioral: Negative for confusion.      Allergies  Vicodin  Home Medications   Prior to Admission medications   Medication Sig Start Date End Date Taking? Authorizing Provider  azithromycin (ZITHROMAX) 250 MG tablet Take 250-500 mg by mouth daily. 500 mg for day 1 then 250 mg for 4 days    Historical Provider, MD  clindamycin (CLEOCIN) 75 MG/5ML solution Take 20 mLs (300 mg total) by mouth 3 (three) times daily. 07/19/15   Samantha Tripp Dowless, PA-C  metFORMIN (GLUCOPHAGE) 500 MG tablet Take 500 mg by mouth 2 (two) times daily with a meal.    Historical Provider, MD  norethindrone (ORTHO MICRONOR) 0.35 MG tablet Take 1 tablet by mouth daily.    Historical Provider, MD  prednisoLONE (ORAPRED) 15 MG/5ML solution Take 3.3 mLs (10 mg total) by mouth daily before breakfast. 07/19/15   Samantha Tripp Dowless, PA-C     BP 149/84 mmHg  Pulse 105  Temp(Src) 99.2 F (37.3 C) (Oral)  Resp 15  SpO2 100% Physical Exam  Constitutional: She is oriented to person, place, and time. She appears well-developed and well-nourished. No distress.  HENT:  Head: Normocephalic and atraumatic.  Eyes: EOM are normal. Pupils are equal, round, and reactive to light. Right eye exhibits  no nystagmus. Left eye exhibits no nystagmus.  Neck: Normal range of motion. Neck supple.  Cardiovascular: Regular rhythm and intact distal pulses.  Tachycardia present.   Pulmonary/Chest: Effort normal and breath sounds normal. No respiratory distress. She has no wheezes. She has no rales.  Abdominal: Soft. She exhibits no distension. There is no tenderness. There is no rebound and  no guarding.  Obese  Musculoskeletal: Normal range of motion. She exhibits no edema or tenderness.  Neurological: She is alert and oriented to person, place, and time. She has normal strength and normal reflexes. No cranial nerve deficit or sensory deficit. She exhibits normal muscle tone. She displays a negative Romberg sign. Coordination and gait normal.  Skin: Skin is warm and dry. No rash noted.  Psychiatric: She has a normal mood and affect.  Nursing note and vitals reviewed.   ED Course  Procedures (including critical care time) Labs Review Labs Reviewed  CBC WITH DIFFERENTIAL/PLATELET - Abnormal; Notable for the following:    RBC 5.18 (*)    All other components within normal limits  COMPREHENSIVE METABOLIC PANEL - Abnormal; Notable for the following:    Glucose, Bld 110 (*)    All other components within normal limits  I-STAT VENOUS BLOOD GAS, ED - Abnormal; Notable for the following:    pH, Ven 7.380 (*)    pCO2, Ven 41.7 (*)    pO2, Ven 61.0 (*)    Bicarbonate 24.6 (*)    All other components within normal limits  MAGNESIUM  URINALYSIS, DIPSTICK ONLY  D-DIMER, QUANTITATIVE (NOT AT [facility])  POC URINE PREG, ED  I-STAT TROPOININ, ED    Imaging Review No results found. I have personally reviewed and evaluated these images and lab results as part of my medical decision-making.   EKG Interpretation   Date/Time:  Thursday February 28 2016 16:14:58 EDT Ventricular Rate:  107 PR Interval:  148 QRS Duration: 136 QT Interval:  374 QTC Calculation: 499 R Axis:   -73 Text Interpretation:  Sinus tachycardia Probable left atrial enlargement  RBBB and LAFB Confirmed by [facility]ESNER MD, Corene Cornea [phone]) on 02/28/2016 4:52:16 PM      MDM   Final diagnoses:  Dizziness  Dehydration    Patient is a 45 year old female with newly diagnosed diabetes who presents with 2 days of lightheadedness and tachycardia. On presentation patient is afebrile but tachycardic to 127 on exam. No other  focal findings. Neurologic exam is normal. Orthostatics are negative. EKG shows sinus tachycardia with right bundle-branch block. Differential includes dehydration vs electrolyte abnormality. Less likely PE or neurologic cause. CBG prior to arrival was 227 (patient had just drunk a soda prior to EMS arrival). Doubt symptoms are due to hypoglycemia. The patient is overall low risk for pulmonary embolism, but cannot apply PERC as she is tachycardic and takes OCPs. Overall low risk per Wells criteria. D-dimer is negative. Doubt pulmonary embolism. No anemia. No electrolyte abnormalities or acidosis. Glucose on CMP is improved to 110. Patient received 2 L of IV fluids and her heart rate trended down appropriately to 90s. Given otherwise normal workup and improvement in HR after IV fluids, symptoms are likely due to dehydration.   On re-evaluation, patient is ambulating without difficulty and tolerating po. She reports her symptoms significantly improved.  Discharged in stable condition. Strict return precautions discussed. Advised patient to drink plenty of fluids. Patient will follow up with her primary care doctor tomorrow. She is in agreement with this plan.  Patient seen and discussed with  Dr. Dayna Barker, ED attending.    Gibson Ramp, MD 02/29/16 [id number]  Merrily Pew, MD 02/29/16 [phone]

## 2016-02-28 NOTE — ED Notes (Signed)
Patient ambulated to restroom from room with steady gait, reports being a little dizzy upon standing, but she feels "300% better than when she came in." MD made aware.

## 2016-02-28 NOTE — ED Provider Notes (Signed)
I saw and evaluated the patient, reviewed the resident's note and I agree with the findings and plan.  45 yo F w/ dizziness starting today, mostly associated with cough after having had an URI for the last week or so. Exam benign aside from tachycardia. Appears well otherwise. Plan to eval for PE with d dimer, fluid hydrate, check H&H and reeval.    EKG Interpretation   Date/Time:  Thursday February 28 2016 16:14:58 EDT Ventricular Rate:  107 PR Interval:  148 QRS Duration: 136 QT Interval:  374 QTC Calculation: 499 R Axis:   -73 Text Interpretation:  Sinus tachycardia Probable left atrial enlargement  RBBB and LAFB Confirmed by [facility] MD, Corene Cornea [phone]) on 02/28/2016 4:52:16 PM        Merrily Pew, MD 02/29/16 1801

## 2016-02-28 NOTE — ED Notes (Signed)
Patient reports feeling a little shaky. Olen Cordial, EMT checked CBG, which was 99. Patient states this is low for her. Provided pt with graham crackers, [place] sandwich, and apple juice. Will d/c after pt eats.

## 2016-02-28 NOTE — ED Notes (Signed)
Pt to ER by [facility] with complaint of dizziness and light headedness x 2 days. Pt was originally going to drive to ER with a friend but states "it got so bad I didn't think I was gonna make it." per EMS on 12-lead, pt showing RBBB, which is new for patient. Pt is newly diagnosed diabetic with CBG 227. VS - 160/89, HR 113, O2 100% on RA.

## 2016-06-17 ENCOUNTER — Ambulatory Visit (INDEPENDENT_AMBULATORY_CARE_PROVIDER_SITE_OTHER): Payer: 59 | Admitting: Neurology

## 2016-06-17 ENCOUNTER — Encounter: Payer: Self-pay | Admitting: Neurology

## 2016-06-17 VITALS — BP 110/70 | HR 80 | Ht 65.0 in | Wt 333.6 lb

## 2016-06-17 DIAGNOSIS — R42 Dizziness and giddiness: Secondary | ICD-10-CM

## 2016-06-17 DIAGNOSIS — G35 Multiple sclerosis: Secondary | ICD-10-CM

## 2016-06-17 NOTE — Progress Notes (Signed)
Note routed

## 2016-06-17 NOTE — Patient Instructions (Signed)
The dizziness is vague and does not sound neurologic.  However, with history of multiple sclerosis, we need to check MRI of brain and cervical spine with and without contrast.  Will contact you with results and whether follow up or other testing is needed.

## 2016-06-17 NOTE — Progress Notes (Signed)
NEUROLOGY CONSULTATION NOTE  Ann [name] MRN: [id number] DOB: 04/24/1971  Referring provider: Dr. Justin Mend Primary care provider: Dr. Justin Mend  Reason for consult:  Multiple sclerosis  HISTORY OF PRESENT ILLNESS: Ann [name] is a 45 year old right-handed woman with hypertension and type 2 diabetes who presents for multiple sclerosis.  History obtained by patient and PCP note.  Around 2008, she developed sudden onset double vision while driving.  It didn't last long.  She was evaluated by the ophthalmologist who told her that her eyes looked fine.  She followed up with neurology where she had an MRI of the brain and was told that she has multiple sclerosis.  She did not undergo any other testing and was never started on disease modifying therapy.  About a year later, she had an episode where she had clouding of vision in one of her eyes, possibly the left eye.  Again, her eye exam was unremarkable.  She received 3 days of IV steroids at home and it cleared.  She was never started on disease modifying therapy.  She has no family history of MS.  She had been doing well until this past May.  She developed sinusitis along with dizziness.  She was treated for sinusitis but the dizziness persisted.  She describes the dizziness as a "wobbly" feeling and feeling of off-balance for a moment.  There is no spinning, near-syncope, or double vision.  If she is standing, she may need to catch herself, but she never has fallen.  It can occur while sitting as well.  She particularly notices it if she stands up, but walking around and drinking water helps.   She presented to the ED for dizziness on 02/28/16.  Her physical exam was normal.  EKG was normal.  CBC and CMP were unremarkable.  03/04/16:  Na 140, K 4.2, Cl 4.2, CO2 29, glucose 83, BUN 9, Cr 0.98, TIL 0.6, ALP 60, AST 12 and ALT 11; TSH 1.69  PAST MEDICAL HISTORY: Past Medical History:  Diagnosis Date  . Diabetes mellitus without complication  ([place])     PAST SURGICAL HISTORY: Past Surgical History:  Procedure Laterality Date  . APPENDECTOMY    . CESAREAN SECTION      MEDICATIONS: Current Outpatient Prescriptions on File Prior to Visit  Medication Sig Dispense Refill  . ibuprofen (ADVIL,MOTRIN) 800 MG tablet Take 1 tablet by mouth 3 (three) times daily as needed.  3  . metFORMIN (GLUCOPHAGE) 500 MG tablet Take 500 mg by mouth 2 (two) times daily with a meal.    . norethindrone (JOLIVETTE) 0.35 MG tablet Take 1 tablet by mouth daily.    [name] DELICA LANCETS FINE MISC Use as directed  11  . ONETOUCH VERIO test strip Use as directed  11   No current facility-administered medications on file prior to visit.     ALLERGIES: Allergies  Allergen Reactions  . Amoxicillin Hives and Itching  . Vicodin [Hydrocodone-Acetaminophen] Itching    FAMILY HISTORY: History reviewed. No pertinent family history.  SOCIAL HISTORY: Social History   Social History  . Marital status: Single    Spouse name: N/A  . Number of children: N/A  . Years of education: N/A   Occupational History  . application reviewer    Social History Main Topics  . Smoking status: Never Smoker  . Smokeless tobacco: Never Used  . Alcohol use No  . Drug use: No  . Sexual activity: Not on file   Other Topics  Concern  . Not on file   Social History Narrative   Patient lives with mom in a one story home.  Has one daughter.  Works as an Social worker.  Education: BS    REVIEW OF SYSTEMS: Constitutional: No fevers, chills, or sweats, no generalized fatigue, change in appetite Eyes: No visual changes, double vision, eye pain Ear, nose and throat: No hearing loss, ear pain, nasal congestion, sore throat Cardiovascular: No chest pain, palpitations Respiratory:  No shortness of breath at rest or with exertion, wheezes GastrointestinaI: No nausea, vomiting, diarrhea, abdominal pain, fecal incontinence Genitourinary:  No dysuria, urinary  retention or frequency Musculoskeletal:  No neck pain, back pain Integumentary: No rash, pruritus, skin lesions Neurological: as above Psychiatric: No depression, insomnia, anxiety Endocrine: No palpitations, fatigue, diaphoresis, mood swings, change in appetite, change in weight, increased thirst Hematologic/Lymphatic:  No purpura, petechiae. Allergic/Immunologic: no itchy/runny eyes, nasal congestion, recent allergic reactions, rashes  PHYSICAL EXAM: Vitals:   06/17/16 1442  BP: 110/70  Pulse: 80   General: No acute distress.  Patient appears well-groomed.  Morbidly obese Head:  Normocephalic/atraumatic Eyes:  fundi examined but not visualized Neck: supple, no paraspinal tenderness, full range of motion Back: No paraspinal tenderness Heart: regular rate and rhythm Lungs: Clear to auscultation bilaterally. Vascular: No carotid bruits. Neurological Exam: Mental status: alert and oriented to person, place, and time, recent and remote memory intact, fund of knowledge intact, attention and concentration intact, speech fluent and not dysarthric, language intact. Cranial nerves: CN I: not tested CN II: pupils equal, round and reactive to light, visual fields intact CN III, IV, VI:  full range of motion, no nystagmus, no ptosis CN V: facial sensation intact CN VII: upper and lower face symmetric CN VIII: hearing intact CN IX, X: gag intact, uvula midline CN XI: sternocleidomastoid and trapezius muscles intact CN XII: tongue midline Bulk & Tone: normal, no fasciculations. Motor:  5/5 throughout  Sensation: temperature and vibration sensation intact. Deep Tendon Reflexes:  2+ throughout, toes downgoing.  Finger to nose testing:  Without dysmetria.  Heel to shin:  Without dysmetria.  Gait:  Normal station and stride.  Able to turn and tandem walk. Romberg negative.  IMPRESSION: Dizziness.  Nonspecific and not clearly neurologic.  She carries a diagnosis of MS by a neurologist, but  she was never started on disease modifying therapy, which is suspect. Morbid obesity  PLAN: 1.  We will get MRI of brain and cervical spine with and without contrast 2.  We will contact patient with results and whether follow up for further workup and management is needed.  Otherwise, I would not have an explanation for her vague dizziness. 3.  Weight loss  Thank you for allowing me to take part in the care of this patient.  Metta Clines, DO  CC:  Maurice Small, MD

## 2016-07-08 ENCOUNTER — Telehealth: Payer: Self-pay

## 2016-07-08 ENCOUNTER — Ambulatory Visit
Admission: RE | Admit: 2016-07-08 | Discharge: 2016-07-08 | Disposition: A | Discharge status: Home / Self Care | Payer: 59 | Source: Ambulatory Visit | Attending: Neurology | Admitting: Neurology

## 2016-07-08 DIAGNOSIS — G35 Multiple sclerosis: Secondary | ICD-10-CM

## 2016-07-08 MED ORDER — GADOBENATE DIMEGLUMINE 529 MG/ML IV SOLN
20.0000 mL | Freq: Once | INTRAVENOUS | Status: AC | PRN
  Administered 2016-07-08: 20 mL via INTRAVENOUS

## 2016-07-08 NOTE — Telephone Encounter (Signed)
[place] desk staff aware that pt needs next cancelled appointment slot. Pt aware that front desk will be calling with an appointment.

## 2016-07-08 NOTE — Telephone Encounter (Signed)
-----   Message from Pieter Partridge, DO sent at 07/08/2016  3:06 PM EDT ----- Patient needs to come in for follow up to discuss MRI and where to go from here.

## 2016-07-11 ENCOUNTER — Ambulatory Visit (INDEPENDENT_AMBULATORY_CARE_PROVIDER_SITE_OTHER): Payer: 59 | Admitting: Neurology

## 2016-07-11 ENCOUNTER — Encounter: Payer: Self-pay | Admitting: Neurology

## 2016-07-11 VITALS — BP 138/84 | HR 82 | Ht 64.5 in | Wt 333.0 lb

## 2016-07-11 DIAGNOSIS — R42 Dizziness and giddiness: Secondary | ICD-10-CM | POA: Diagnosis not present

## 2016-07-11 NOTE — Patient Instructions (Addendum)
Please review the medications and contact me on Monday or Tuesday with your choice.  We will get that set up and I will have you follow up in 6 months.  Tysabri Tecfidera Gilenya Aubagio

## 2016-07-11 NOTE — Progress Notes (Signed)
Chart forwarded.  

## 2016-07-11 NOTE — Progress Notes (Signed)
NEUROLOGY FOLLOW UP OFFICE NOTE  Ann [name] [id number]  HISTORY OF PRESENT ILLNESS: Ann [name] is a 45 year old right-handed woman with hypertension and type 2 diabetes who follows up multiple sclerosis and dizziness.  UPDATE: MRI of brain with and without contrast from 07/08/16 was personally reviewed and revealed T2 and FLAIR hyperintense lesions in the cerebral white matter oriented perpendicular to the ventricles, as well as mild cerebellar involvement and volume loss of the corpus callosum.  Two small areas of enhancement are see in the left corona radiata.  MRI of cervical spine with and without contrast was personally reviewed and revealed patchy abnormal cord signal at the C3-4 level  , non-enhancing.   HISTORY: Around 2008, she developed sudden onset double vision while driving.  It didn't last long.  She was evaluated by the ophthalmologist who told her that her eyes looked fine.  She followed up with neurology where she had an MRI of the brain and was told that she has multiple sclerosis.  She did not undergo any other testing and was never started on disease modifying therapy.  About a year later, she had an episode where she had clouding of vision in one of her eyes, possibly the left eye.  Again, her eye exam was unremarkable.  She received 3 days of IV steroids at home and it cleared.  She was never started on disease modifying therapy.   She has no family history of MS.   She had been doing well until this past May.  She developed sinusitis along with dizziness.  She was treated for sinusitis but the dizziness persisted.  She describes the dizziness as a "wobbly" feeling and feeling of off-balance for a moment.  There is no spinning, near-syncope, or double vision.  If she is standing, she may need to catch herself, but she never has fallen.  It can occur while sitting as well.  She particularly notices it if she stands up, but walking around and drinking water helps.     She presented to the ED for dizziness on 02/28/16.  Her physical exam was normal.  EKG was normal.  CBC and CMP were unremarkable.   03/04/16:  Na 140, K 4.2, Cl 4.2, CO2 29, glucose 83, BUN 9, Cr 0.98, TIL 0.6, ALP 60, AST 12 and ALT 11; TSH 1.69  PAST MEDICAL HISTORY: Past Medical History:  Diagnosis Date  . Diabetes mellitus without complication [facility])     MEDICATIONS: Current Outpatient Prescriptions on File Prior to Visit  Medication Sig Dispense Refill  . ibuprofen (ADVIL,MOTRIN) 800 MG tablet Take 1 tablet by mouth 3 (three) times daily as needed.  3  . metFORMIN (GLUCOPHAGE) 500 MG tablet Take 500 mg by mouth 2 (two) times daily with a meal.    . norethindrone (JOLIVETTE) 0.35 MG tablet Take 1 tablet by mouth daily.    Ann [name] DELICA LANCETS FINE MISC Use as directed  11  . ONETOUCH VERIO test strip Use as directed  11   No current facility-administered medications on file prior to visit.     ALLERGIES: Allergies  Allergen Reactions  . Amoxicillin Hives and Itching  . Vicodin [Hydrocodone-Acetaminophen] Itching    FAMILY HISTORY: No family history on file.  SOCIAL HISTORY: Social History   Social History  . Marital status: Single    Spouse name: N/A  . Number of children: N/A  . Years of education: N/A   Occupational History  . application reviewer  Social History Main Topics  . Smoking status: Never Smoker  . Smokeless tobacco: Never Used  . Alcohol use No  . Drug use: No  . Sexual activity: Not on file   Other Topics Concern  . Not on file   Social History Narrative   Patient lives with mom in a one story home.  Has one daughter.  Works as an Social worker.  Education: BS    REVIEW OF SYSTEMS: Constitutional: No fevers, chills, or sweats, no generalized fatigue, change in appetite Eyes: No visual changes, double vision, eye pain Ear, nose and throat: No hearing loss, ear pain, nasal congestion, sore throat Cardiovascular: No chest  pain, palpitations Respiratory:  No shortness of breath at rest or with exertion, wheezes GastrointestinaI: No nausea, vomiting, diarrhea, abdominal pain, fecal incontinence Genitourinary:  No dysuria, urinary retention or frequency Musculoskeletal:  No neck pain, back pain Integumentary: No rash, pruritus, skin lesions Neurological: as above Psychiatric: No depression, insomnia, anxiety Endocrine: No palpitations, fatigue, diaphoresis, mood swings, change in appetite, change in weight, increased thirst Hematologic/Lymphatic:  No purpura, petechiae. Allergic/Immunologic: no itchy/runny eyes, nasal congestion, recent allergic reactions, rashes  PHYSICAL EXAM: Vitals:   07/11/16 0827  BP: 138/84  Pulse: 82   General: No acute distress.  Patient appears well-groomed.  Morbidly obese body habitus. Head:  Normocephalic/atraumatic Eyes:  Fundi examined but not visualized Timed 25 foot walk:  4.88 seconds  IMPRESSION: Multiple sclerosis Dizziness, vague, cannot be sure if related to MS or not.  However, beginning disease-modifying therapy is imperative. Morbid obesity  PLAN: We discussed in depth the diagnosis and the various disease-modifying therapies, including risks and benefits.  I recommended Tysabri or one of the oral medications.  We provided her information on MS, as well as the medications.  She will review the information over the weekend and get back to [place] on Monday or Tuesday with her choice.  She will follow up in 6 months.  Weight loss recommended  27 minutes spent face to face with patient, over 90% spent discussing risks and benefits of various disease-modifying agents.  Metta Clines, DO  CC:  Maurice Small, MD

## 2016-07-31 ENCOUNTER — Encounter: Payer: Self-pay | Admitting: Endocrinology

## 2016-07-31 ENCOUNTER — Ambulatory Visit (INDEPENDENT_AMBULATORY_CARE_PROVIDER_SITE_OTHER): Payer: 59 | Admitting: Endocrinology

## 2016-07-31 VITALS — BP 122/82 | HR 79 | Ht 64.5 in | Wt 337.0 lb

## 2016-07-31 DIAGNOSIS — R6889 Other general symptoms and signs: Secondary | ICD-10-CM

## 2016-07-31 DIAGNOSIS — R42 Dizziness and giddiness: Secondary | ICD-10-CM

## 2016-07-31 LAB — BASIC METABOLIC PANEL
BUN: 12 mg/dL (ref 6–23)
CHLORIDE: 102 meq/L (ref 96–112)
CO2: 28 mEq/L (ref 19–32)
Calcium: 9.5 mg/dL (ref 8.4–10.5)
Creatinine, Ser: 0.99 mg/dL (ref 0.40–1.20)
GFR: 77.41 mL/min (ref >60.00)
GLUCOSE: 101 mg/dL — AB (ref 70–99)
POTASSIUM: 4.1 meq/L (ref 3.5–5.1)
SODIUM: 138 meq/L (ref 135–145)

## 2016-07-31 LAB — CORTISOL: Cortisol, Plasma: 8.6 ug/dL

## 2016-07-31 NOTE — Progress Notes (Signed)
Patient ID: Ann [name], female   DOB: 1971/01/22, 45 y.o.   MRN: [id number]            Referring physician: Maurice Small  Chief complaint: Dizziness  History of Present Illness:  She apparently has had dizziness off and on since May 2017. She describes this as a feeling of going off balance without any lightheaded sensation or faintness. She does not feel the room spinning The symptoms can be worse at times and then she cannot feel steady walking and will not drive at that time In the beginning she had some nausea associated with this but not subsequently  She has been evaluated several times in urgent care centers and emergency room without any diagnosis being made In the last few days dizziness is better She again does not feel lightheaded when standing up Does not complain of any decreased appetite, weight loss, nausea, abdominal pain or diarrhea  Her neurologist has now told her that she has multiple sclerosis based on recent MRI Patient was referred here because an urgent care center doctor felt that her blood pressure was abnormal standing up and she needed to be evaluated for Addison's disease but no records are available   Wt Readings from Last 3 Encounters:  07/31/16 (!) 337 lb (152.9 kg)  07/11/16 (!) 333 lb (151 kg)  06/17/16 (!) 333 lb 9 oz (151.3 kg)    Past Medical History:  Diagnosis Date  . Diabetes mellitus without complication [facility])     Past Surgical History:  Procedure Laterality Date  . APPENDECTOMY    . CESAREAN SECTION      History reviewed. No pertinent family history.  Social History:  reports that she has never smoked. She has never used smokeless tobacco. She reports that she does not drink alcohol or use drugs.  Allergies:  Allergies  Allergen Reactions  . Amoxicillin Hives and Itching  . Vicodin [Hydrocodone-Acetaminophen] Itching      Medication List       Accurate as of 07/31/16 11:37 AM. Always use your most recent med list.            ibuprofen 800 MG tablet Commonly known as:  ADVIL,MOTRIN Take 1 tablet by mouth 3 (three) times daily as needed.   JOLIVETTE 0.35 MG tablet Generic drug:  norethindrone Take 1 tablet by mouth daily.   metFORMIN 500 MG tablet Commonly known as:  GLUCOPHAGE Take 500 mg by mouth 2 (two) times daily with a meal.   ONETOUCH DELICA LANCETS FINE Misc Use as directed   ONETOUCH VERIO test strip Generic drug:  glucose blood Use as directed            Review of Systems  Constitutional: Negative for weight loss.  Cardiovascular: Negative for leg swelling.  Endocrine: Negative for fatigue.  Musculoskeletal: Negative for joint pain.  Skin: Negative for abnormal pigmentation.  Neurological: Negative for numbness and tingling.    DIABETES: She is on metformin only and her A1c has been fairly good.  She thinks her recent and sugar range is 125-158  No numbness in her feet, no weakness in her legs  No change in bowel habits  No history of thyroid disease, previous TSH normal   PHYSICAL EXAM:  BP 138/90   Pulse 79   Ht 5' 4.5" (1.638 m)   Wt (!) 337 lb (152.9 kg)   SpO2 96%   BMI 56.95 kg/m   Standing blood pressure on left side was 122/82 and right  side was 134/90   GENERAL:.  She has Marked generalized obesity   No pallor, clubbing, lymphadenopathy or edema.   Skin:  no rash or pigmentation.  EYES:  Externally normal.    ENT: Oral mucosa and tongue normal.  No oral pigmentation seen  THYROID:  Not palpable.  HEART:  Normal  S1 and S2; no murmur or click.  CHEST:  Normal shape.  Lungs: Vescicular breath sounds heard equally.  No crepitations/ wheeze.  ABDOMEN:  No distention.  Liver and spleen not palpable.  No other mass or tenderness.  NEUROLOGICAL: .Reflexes are normal/slightly decreased bilaterally at biceps.  JOINTS:  Normal.   ASSESSMENT:    Episodic dizziness, more symptomatic of gait imbalance than orthostatic lightheadedness  without documented orthostatic hypotension.  She does not have any symptoms of adrenal insufficiency such as weight loss or change in appetite/nausea.  Appears that her gait imbalance episodes are related to her multiple sclerosis which is new diagnosed   PLAN:    Will check random cortisol and BMP as this has not been done recently.  If labs are normal no further evaluation needed  Consultation note sent to the referring physician  [facility] 07/31/2016, 11:37 AM   Addendum: Cortisol and BMP normal, no further workup indicated

## 2016-07-31 NOTE — Progress Notes (Signed)
Please let patient know that the lab result is normal and no further action needed, please fax to PCP

## 2017-01-09 ENCOUNTER — Ambulatory Visit: Payer: 59 | Admitting: Neurology

## 2017-02-06 ENCOUNTER — Other Ambulatory Visit: Payer: Self-pay | Admitting: Obstetrics and Gynecology

## 2017-02-10 LAB — CYTOLOGY - PAP

## 2017-06-11 ENCOUNTER — Ambulatory Visit: Payer: Self-pay | Admitting: Podiatry

## 2017-06-22 ENCOUNTER — Ambulatory Visit (INDEPENDENT_AMBULATORY_CARE_PROVIDER_SITE_OTHER): Payer: 59

## 2017-06-22 ENCOUNTER — Ambulatory Visit (INDEPENDENT_AMBULATORY_CARE_PROVIDER_SITE_OTHER): Payer: 59 | Admitting: Podiatry

## 2017-06-22 ENCOUNTER — Encounter: Payer: Self-pay | Admitting: Podiatry

## 2017-06-22 ENCOUNTER — Other Ambulatory Visit: Payer: Self-pay | Admitting: Podiatry

## 2017-06-22 VITALS — BP 144/90 | HR 84 | Resp 16 | Ht 65.0 in | Wt 325.0 lb

## 2017-06-22 DIAGNOSIS — M25571 Pain in right ankle and joints of right foot: Secondary | ICD-10-CM

## 2017-06-22 DIAGNOSIS — M779 Enthesopathy, unspecified: Secondary | ICD-10-CM

## 2017-06-22 MED ORDER — TRIAMCINOLONE ACETONIDE 10 MG/ML IJ SUSP
10.0000 mg | Freq: Once | INTRAMUSCULAR | Status: AC
  Administered 2017-06-22: 10 mg

## 2017-06-22 MED ORDER — DICLOFENAC SODIUM 75 MG PO TBEC: 75.0000 mg | DELAYED_RELEASE_TABLET | Freq: Two times a day (BID) | ORAL | 2 refills | Status: DC

## 2017-06-22 NOTE — Progress Notes (Signed)
   Subjective:    Patient ID: Ann [name], female    DOB: [birth year], 46 y.o.   MRN: [id number]  HPI Chief Complaint  Patient presents with  . Ankle Pain    Right foot-medial side; x3 months; pt Diabetic Type 2; Sugar=139 this am; A1C=Does not know      Review of Systems  All other systems reviewed and are negative.      Objective:   Physical Exam        Assessment & Plan:

## 2017-06-23 NOTE — Progress Notes (Signed)
Subjective:    Patient ID: Ann [name], female   DOB: 46 y.o.   MRN: [id number]   HPI patient presents with a lot of discomfort in the right medial foot of approximate 3 months duration. States that there is been some swelling and she no she has a flat foot and she has to be on her foot at work all the time. States her sugars been under reasonably good control admits that she has obesity and states that she has never smoked    Review of Systems  All other systems reviewed and are negative.       Objective:  Physical Exam  Constitutional: She appears well-developed and well-nourished.  Pulmonary/Chest: Effort normal.  Musculoskeletal: Normal range of motion.  Neurological: She is alert.  Skin: Skin is warm.  Nursing note and vitals reviewed.  neurovascular status intact muscle strength adequate range of motion within normal limits with patient found to have significant depression of the arch bilateral with edema and discomfort of the posterior tibial tendon right at its insertion into the navicular and more proximal. I did not note muscle strength loss and patient was found to have good digital perfusion and is well oriented 3     Assessment:   Acute posterior tibial tendinitis right with significant flatfoot deformity and obesity is complicating factors      Plan:    H&P x-rays reviewed and today I did careful sheath injection right 3 mg Kenalog 5 mg Xylocaine advised on support she has a boot at home which she will wear and also I dispense fascial brace when she can wear. Discussed long-term orthotics and patient be seen back and may require MRI if symptoms persist  X-rays indicate that there is collapse medial longitudinal arch right over left

## 2017-07-06 ENCOUNTER — Ambulatory Visit (INDEPENDENT_AMBULATORY_CARE_PROVIDER_SITE_OTHER): Payer: 59 | Admitting: Podiatry

## 2017-07-06 ENCOUNTER — Encounter: Payer: Self-pay | Admitting: Podiatry

## 2017-07-06 VITALS — BP 137/91 | HR 88 | Resp 16

## 2017-07-06 DIAGNOSIS — M779 Enthesopathy, unspecified: Secondary | ICD-10-CM

## 2017-07-06 DIAGNOSIS — M7750 Other enthesopathy of unspecified foot: Secondary | ICD-10-CM | POA: Diagnosis not present

## 2017-07-06 DIAGNOSIS — M25571 Pain in right ankle and joints of right foot: Secondary | ICD-10-CM

## 2017-07-06 NOTE — Progress Notes (Signed)
Subjective:    Patient ID: Ann [name], female   DOB: 46 y.o.   MRN: [id number]   HPI patient presents stating that she is still having discomfort in her foot and ankle and it has improved some from previous but still sore    ROS      Objective:  Physical Exam neurovascular status intact with significant depression of the arch right with discomfort posterior tibial tendon that's present with mild edema but not severe and wearing her fascial brace     Assessment:   Continued posterior tibial tendinitis right secondary to foot structure with inflammation of the posterior tibial tendon     Plan:    H&P condition reviewed and at this point I've recommended long-term orthotics to support the plantar arch and she scanned for customized orthotic devices and I reviewed with her the boot that I want her to wear at all times. If symptoms were to continue work and the need to get an MRI to rule out a tear of the posterior tibial tendon

## 2017-07-27 ENCOUNTER — Encounter: Payer: 59 | Admitting: Orthotics

## 2017-08-18 ENCOUNTER — Ambulatory Visit: Payer: 59 | Admitting: Orthotics

## 2017-08-18 DIAGNOSIS — M779 Enthesopathy, unspecified: Secondary | ICD-10-CM

## 2017-08-18 DIAGNOSIS — M25571 Pain in right ankle and joints of right foot: Secondary | ICD-10-CM

## 2017-08-18 NOTE — Progress Notes (Signed)
Patient came in today to pick up custom made foot orthotics.  The goals were accomplished and the patient reported no dissatisfaction with said orthotics.  Patient was advised of breakin period and how to report any issues. 

## 2017-08-27 ENCOUNTER — Ambulatory Visit: Payer: 59 | Admitting: Orthotics

## 2017-08-27 DIAGNOSIS — M779 Enthesopathy, unspecified: Secondary | ICD-10-CM

## 2017-09-10 ENCOUNTER — Encounter: Payer: 59 | Admitting: Orthotics

## 2017-09-11 NOTE — Progress Notes (Signed)
Ck orthoics, ok w/ patient for fit.

## 2017-09-23 ENCOUNTER — Other Ambulatory Visit: Payer: 59 | Admitting: Orthotics

## 2017-10-05 ENCOUNTER — Ambulatory Visit (INDEPENDENT_AMBULATORY_CARE_PROVIDER_SITE_OTHER): Payer: 59 | Admitting: Orthotics

## 2017-10-05 DIAGNOSIS — M779 Enthesopathy, unspecified: Secondary | ICD-10-CM

## 2017-10-05 DIAGNOSIS — M722 Plantar fascial fibromatosis: Secondary | ICD-10-CM

## 2017-10-05 NOTE — Progress Notes (Signed)
Patient picked up redone custom foot orthotics and found no complaints in fit and or function.  Patient picked up NB shoes for which she will self pay 150.00.Marland Kitchen Shoes fit well and she is well pleased with all.

## 2017-11-06 IMAGING — MR MR KNEE*L* W/O CM
5 series · 35 of 40 positions shown · non-contrast
Comparison: None.

CLINICAL DATA: One week history of knee pain. Injured knee while
walking.

EXAM:
MRI OF THE LEFT KNEE WITHOUT CONTRAST
TECHNIQUE: Multiplanar, multisequence MR imaging of the knee was performed. No
intravenous contrast was administered.

[Series 8: PD fat-sat · axial · left · 4.0mm · 0.47mm/px · z∈[-27,+95]mm · 6 of 29 slices shown (1 of 3)]
[im 1/29]
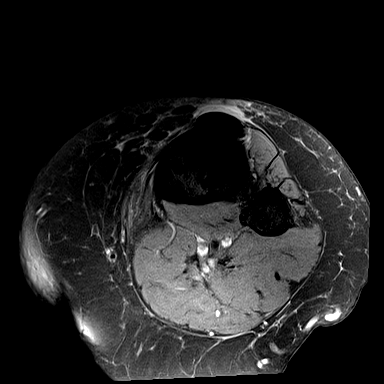
[im 6/29]
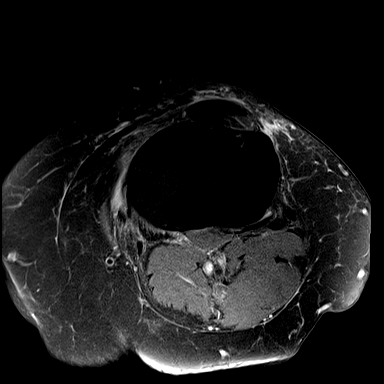
[im 12/29]
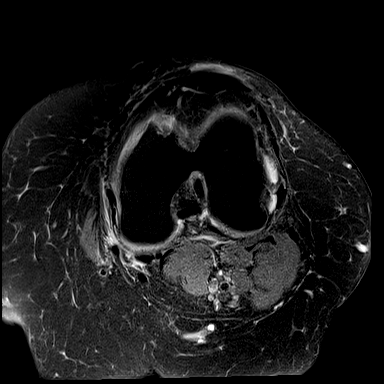
[im 17/29]
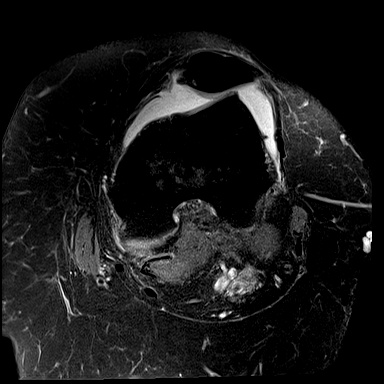
[im 23/29]
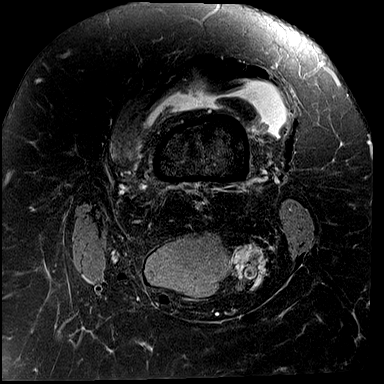
[im 29/29]
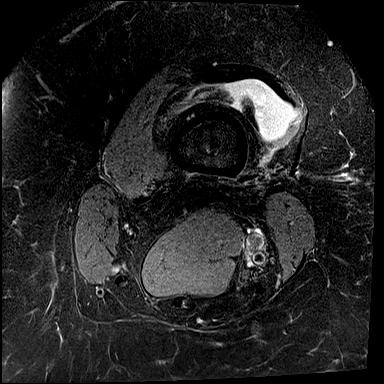

[Series 9: T1 · coronal · left · 3.0mm · 0.47mm/px · 4 of 35 slices shown]
[im 1/35]
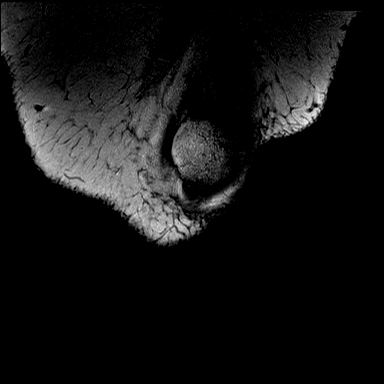
[im 5/35]
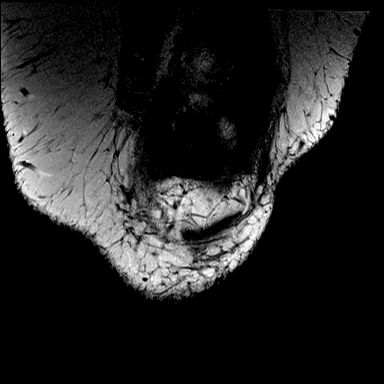
[im 9/35]
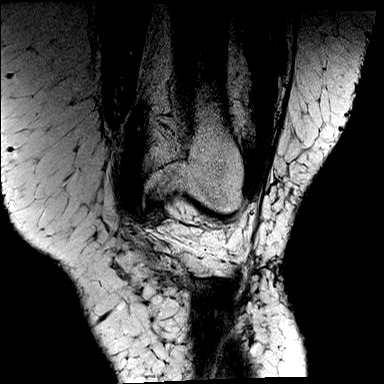
[im 13/35]
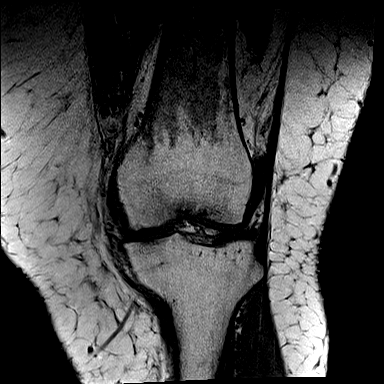

[Series 11: PD fat-sat · sagittal · left · 3.0mm · 0.56mm/px · 7 of 27 slices shown (2 of 3)]
[im 1/27]
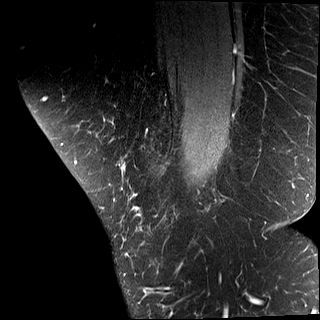
[im 5/27]
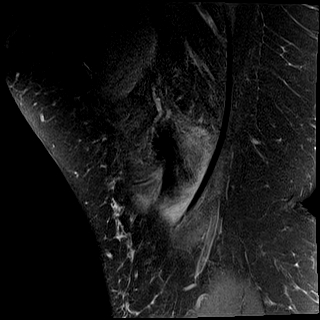
[im 9/27]
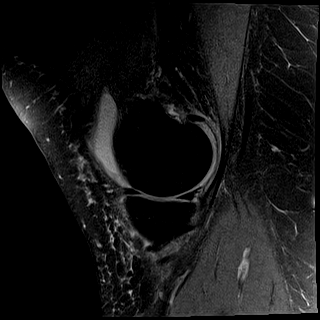
[im 14/27]
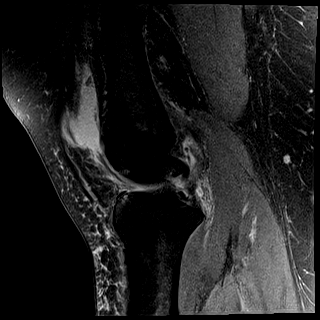
[im 18/27]
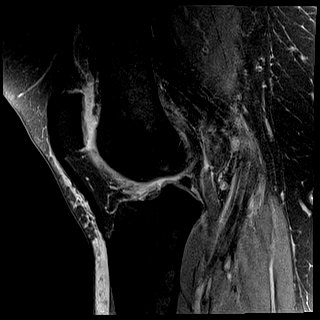
[im 22/27]
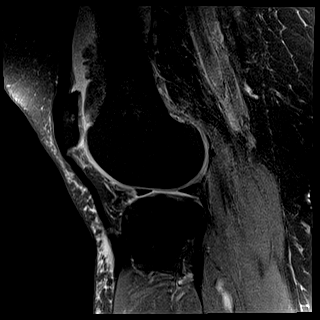
[im 27/27]
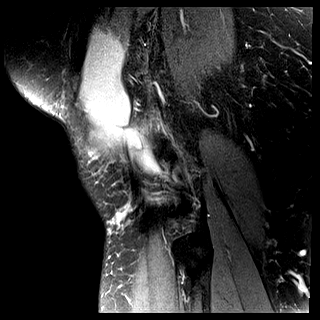

[Series 12: T2 fat-sat · coronal · left · 3.0mm · 0.47mm/px · 9 of 35 slices shown]
[im 1/35]
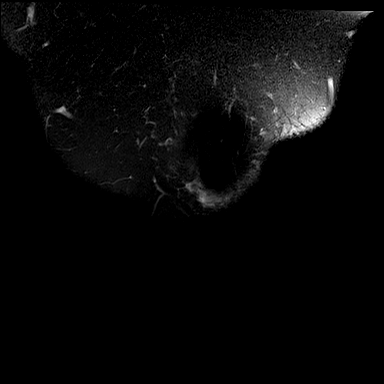
[im 5/35]
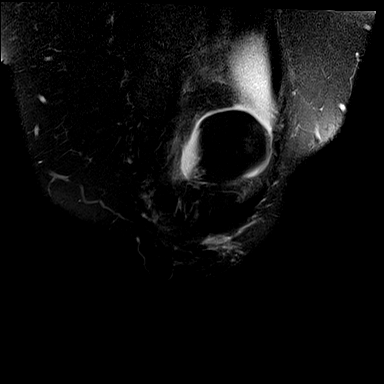
[im 9/35]
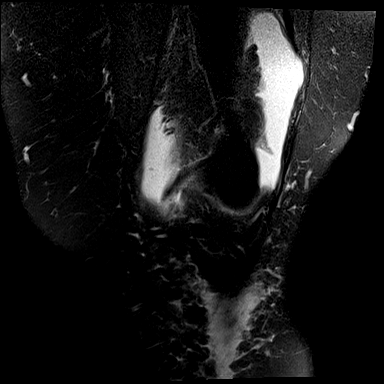
[im 13/35]
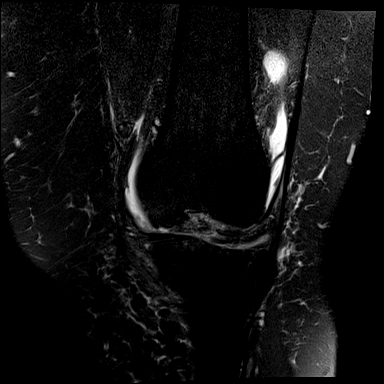
[im 18/35]
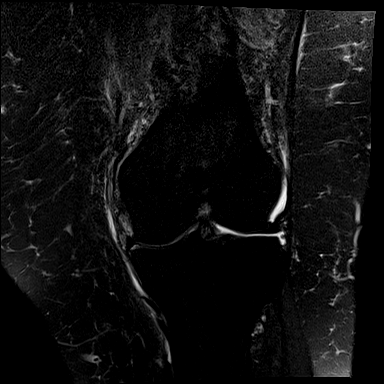
[im 22/35]
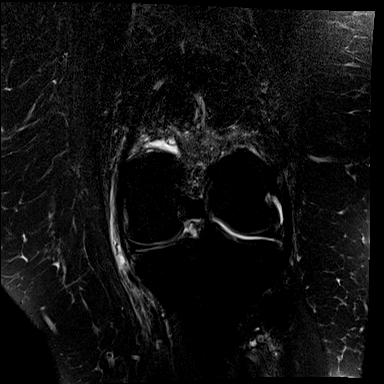
[im 26/35]
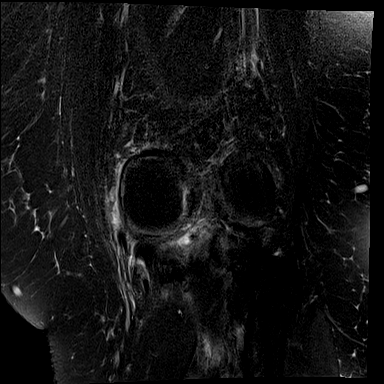
[im 30/35]
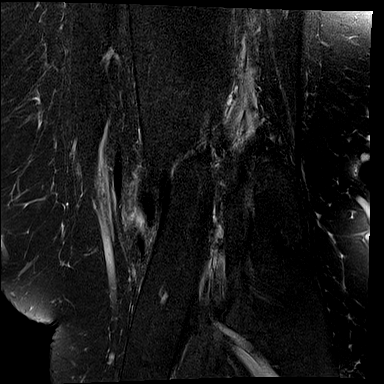
[im 35/35]
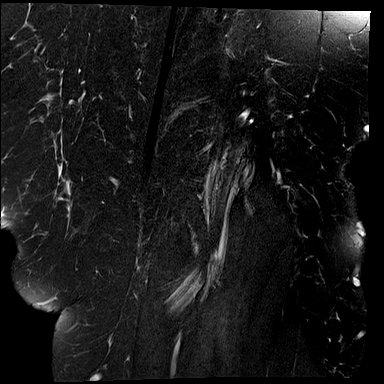

[Series 13: PD fat-sat · coronal · left · 3.0mm · 0.47mm/px · 9 of 35 slices shown (3 of 3)]
[im 1/35]
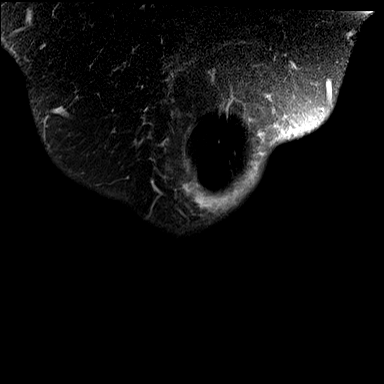
[im 5/35]
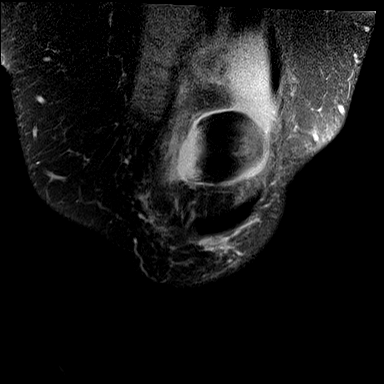
[im 9/35]
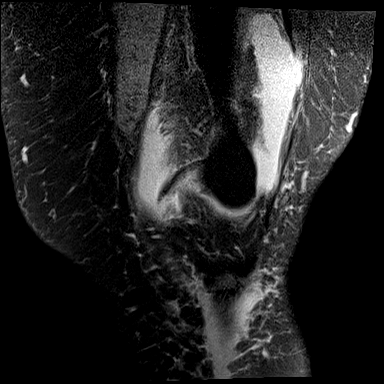
[im 13/35]
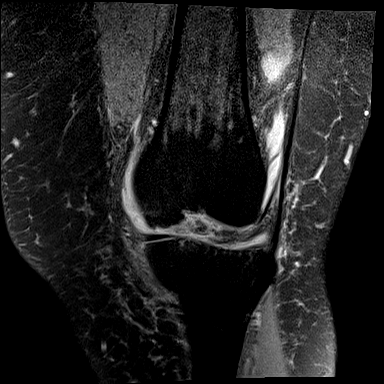
[im 18/35]
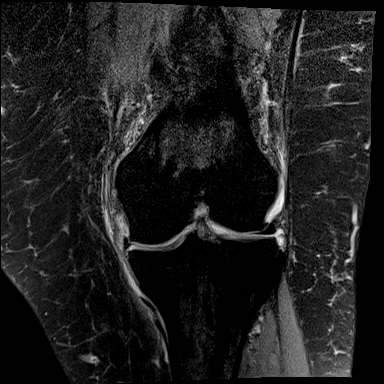
[im 22/35]
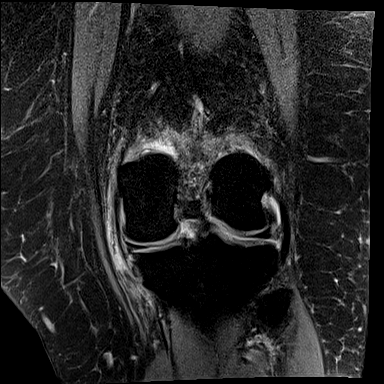
[im 26/35]
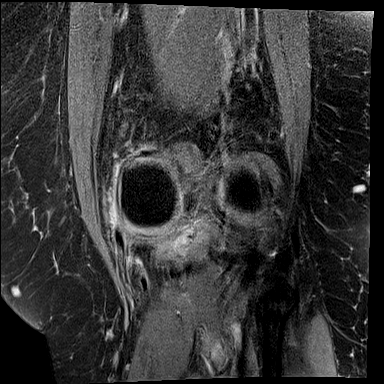
[im 30/35]
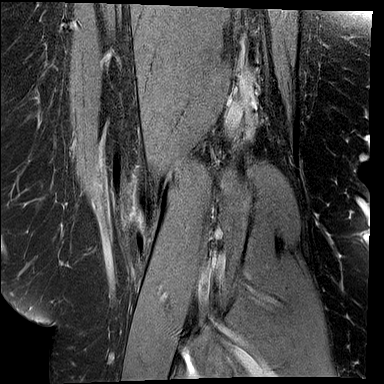
[im 35/35]
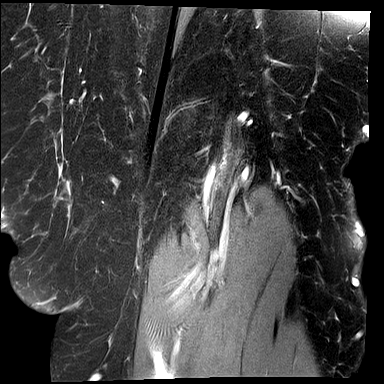

[35 of 40 positions shown; findings below may reference images not displayed]

FINDINGS: MENISCI

Medial meniscus: Inferior articular surface tear involving the mid
body region. The anterior and posterior horns are intact.

Lateral meniscus:  Intact.

LIGAMENTS

Cruciates:  Intact.

Collaterals:  Intact.  Moderate MCL and pes anserine bursitis.

CARTILAGE

Patellofemoral: Moderate degenerative chondrosis/ chondromalacia.
Fairly significant lateral tilt and orientation of the patella in
relation to the femoral trochlear groove.

Medial: Moderate degenerative chondrosis with early joint space
narrowing and spurring.

Lateral: Moderate degenerative chondrosis. Early osteophytic
spurring.

Joint:  Moderate-sized joint effusion and mild synovitis.

Popliteal Fossa:  No popliteal mass or Baker's cyst.

Extensor Mechanism: The patella retinacular structures are intact
and the quadriceps and patellar tendons are intact. Distal patellar
tendinopathy.

Bones: No acute bony findings. Patchy T1 marrow signal abnormality
in the metaphyseal regions is not uncommon in young females with
this body habitus.
IMPRESSION: 1. Inferior articular surface tear involving the mid body region of
the medial meniscus.
2. Intact ligamentous structures and no acute bony findings.
3. Tricompartmental degenerative changes.
4. Lateral tilt and orientation of the patella in relation to the
femoral trochlear groove likely related to body habitus.
5. Moderate-sized joint effusion and mild synovitis.

## 2018-01-20 DIAGNOSIS — S60931A Unspecified superficial injury of right thumb, initial encounter: Secondary | ICD-10-CM | POA: Diagnosis not present

## 2018-01-24 DIAGNOSIS — R05 Cough: Secondary | ICD-10-CM | POA: Diagnosis not present

## 2018-01-24 DIAGNOSIS — J309 Allergic rhinitis, unspecified: Secondary | ICD-10-CM | POA: Diagnosis not present

## 2018-01-27 DIAGNOSIS — G35 Multiple sclerosis: Secondary | ICD-10-CM | POA: Diagnosis not present

## 2018-01-27 DIAGNOSIS — Z6841 Body Mass Index (BMI) 40.0 and over, adult: Secondary | ICD-10-CM | POA: Diagnosis not present

## 2018-01-27 DIAGNOSIS — Z0000 Encounter for general adult medical examination without abnormal findings: Secondary | ICD-10-CM | POA: Diagnosis not present

## 2018-01-27 DIAGNOSIS — I10 Essential (primary) hypertension: Secondary | ICD-10-CM | POA: Diagnosis not present

## 2018-01-27 DIAGNOSIS — E119 Type 2 diabetes mellitus without complications: Secondary | ICD-10-CM | POA: Diagnosis not present

## 2018-03-12 DIAGNOSIS — Z6841 Body Mass Index (BMI) 40.0 and over, adult: Secondary | ICD-10-CM | POA: Diagnosis not present

## 2018-03-12 DIAGNOSIS — Z01419 Encounter for gynecological examination (general) (routine) without abnormal findings: Secondary | ICD-10-CM | POA: Diagnosis not present

## 2018-03-15 DIAGNOSIS — H52203 Unspecified astigmatism, bilateral: Secondary | ICD-10-CM | POA: Diagnosis not present

## 2018-03-15 DIAGNOSIS — H524 Presbyopia: Secondary | ICD-10-CM | POA: Diagnosis not present

## 2018-03-15 DIAGNOSIS — H5213 Myopia, bilateral: Secondary | ICD-10-CM | POA: Diagnosis not present

## 2018-03-15 DIAGNOSIS — E119 Type 2 diabetes mellitus without complications: Secondary | ICD-10-CM | POA: Diagnosis not present

## 2018-03-19 DIAGNOSIS — Z6841 Body Mass Index (BMI) 40.0 and over, adult: Secondary | ICD-10-CM | POA: Diagnosis not present

## 2018-03-19 DIAGNOSIS — Z1231 Encounter for screening mammogram for malignant neoplasm of breast: Secondary | ICD-10-CM | POA: Diagnosis not present

## 2018-04-06 DIAGNOSIS — H00011 Hordeolum externum right upper eyelid: Secondary | ICD-10-CM | POA: Diagnosis not present

## 2018-06-10 DIAGNOSIS — R531 Weakness: Secondary | ICD-10-CM | POA: Diagnosis not present

## 2018-06-11 ENCOUNTER — Other Ambulatory Visit: Payer: Self-pay

## 2018-06-11 ENCOUNTER — Emergency Department (HOSPITAL_COMMUNITY)
Admission: EM | Admit: 2018-06-11 | Discharge: 2018-06-12 | Disposition: A | Discharge status: Home / Self Care | Payer: BLUE CROSS/BLUE SHIELD | Attending: Emergency Medicine | Admitting: Emergency Medicine

## 2018-06-11 ENCOUNTER — Encounter (HOSPITAL_COMMUNITY): Payer: Self-pay | Admitting: Emergency Medicine

## 2018-06-11 DIAGNOSIS — M79603 Pain in arm, unspecified: Secondary | ICD-10-CM

## 2018-06-11 DIAGNOSIS — G35 Multiple sclerosis: Secondary | ICD-10-CM | POA: Insufficient documentation

## 2018-06-11 DIAGNOSIS — M79602 Pain in left arm: Secondary | ICD-10-CM | POA: Diagnosis not present

## 2018-06-11 DIAGNOSIS — Z79899 Other long term (current) drug therapy: Secondary | ICD-10-CM | POA: Insufficient documentation

## 2018-06-11 DIAGNOSIS — E119 Type 2 diabetes mellitus without complications: Secondary | ICD-10-CM | POA: Insufficient documentation

## 2018-06-11 DIAGNOSIS — Z7984 Long term (current) use of oral hypoglycemic drugs: Secondary | ICD-10-CM | POA: Diagnosis not present

## 2018-06-11 DIAGNOSIS — I452 Bifascicular block: Secondary | ICD-10-CM | POA: Diagnosis not present

## 2018-06-11 HISTORY — DX: Multiple sclerosis: G35

## 2018-06-11 LAB — CBC
HEMATOCRIT: 48.4 % — AB (ref 36.0–46.0)
Hemoglobin: 15.3 g/dL — ABNORMAL HIGH (ref 12.0–15.0)
MCH: 26.7 pg (ref 26.0–34.0)
MCHC: 31.6 g/dL (ref 30.0–36.0)
MCV: 84.5 fL (ref 78.0–100.0)
Platelets: 270 10*3/uL (ref 150–400)
RBC: 5.73 MIL/uL — AB (ref 3.87–5.11)
RDW: 13.9 % (ref 11.5–15.5)
WBC: 7.4 10*3/uL (ref 4.0–10.5)

## 2018-06-11 LAB — BASIC METABOLIC PANEL
Anion gap: 10 (ref 5–15)
BUN: 6 mg/dL (ref 6–20)
CHLORIDE: 103 mmol/L (ref 98–111)
CO2: 27 mmol/L (ref 22–32)
Calcium: 9.3 mg/dL (ref 8.9–10.3)
Creatinine, Ser: 1.03 mg/dL — ABNORMAL HIGH (ref 0.44–1.00)
GFR calc non Af Amer: >60 mL/min (ref >60)
Glucose, Bld: 112 mg/dL — ABNORMAL HIGH (ref 70–99)
POTASSIUM: 4 mmol/L (ref 3.5–5.1)
SODIUM: 140 mmol/L (ref 135–145)

## 2018-06-11 LAB — I-STAT TROPONIN, ED: Troponin i, poc: 0 ng/mL (ref 0.00–0.08)

## 2018-06-11 NOTE — ED Triage Notes (Signed)
Pt reports having L arm tightness x 2 days. Pt seen at [facility] yesterday with no abnormal findings. Pt reports feeling a little light-headed. Pt denies SHOB, N/V, CP.

## 2018-06-12 MED ORDER — HYDROXYZINE HCL 25 MG PO TABS: 25.0000 mg | ORAL_TABLET | Freq: Four times a day (QID) | ORAL | 0 refills | Status: DC

## 2018-06-12 NOTE — ED Provider Notes (Signed)
[facility] EMERGENCY DEPARTMENT Provider Note   CSN: [id number] Arrival date & time: 06/11/18  1931     History   Chief Complaint Chief Complaint  Patient presents with  . Arm Pain    HPI Ann [name] is a 47 y.o. female.  Patient presents to the emergency department with a chief complaint of left arm tightness.  She states that she has had the symptoms for the past 2 days.  She denies any chest pain or shortness of breath.  Denies any numbness or weakness.  Denies any injury.  She states that she had a recent loss of the left one, and is concerned that this may be anxiety.  She does have a history of MS, but has never had an MS flare which affects her extremities.  She states all of her prior MS flares been related to her vision only.  She states that she had similar episode in the past when she lost another family member.  The history is provided by the patient. No language interpreter was used.    Past Medical History:  Diagnosis Date  . Diabetes mellitus without complication ([place])   . MS (multiple sclerosis) ([place])     There are no active problems to display for this patient.   Past Surgical History:  Procedure Laterality Date  . APPENDECTOMY    . CESAREAN SECTION       OB History   None      Home Medications    Prior to Admission medications   Medication Sig Start Date End Date Taking? Authorizing Provider  diclofenac (VOLTAREN) 75 MG EC tablet Take 1 tablet (75 mg total) by mouth 2 (two) times daily. 06/22/17   Wallene Huh, DPM  hydrOXYzine (ATARAX/VISTARIL) 25 MG tablet Take 1 tablet (25 mg total) by mouth every 6 (six) hours. 06/12/18   Montine Circle, PA-C  ibuprofen (ADVIL,MOTRIN) 800 MG tablet Take 1 tablet by mouth 3 (three) times daily as needed. 12/31/15   [provider]  metFORMIN (GLUCOPHAGE) 500 MG tablet Take 500 mg by mouth 2 (two) times daily with a meal.    [provider]  norethindrone (JOLIVETTE)  0.35 MG tablet Take 1 tablet by mouth daily.    [provider]  Jonetta Speak LANCETS FINE MISC Use as directed 02/15/16   [provider]  Roma Schanz test strip Use as directed 02/15/16   [provider]    Family History History reviewed. No pertinent family history.  Social History Social History   Tobacco Use  . Smoking status: Never Smoker  . Smokeless tobacco: Never Used  Substance Use Topics  . Alcohol use: No  . Drug use: No     Allergies   Amoxicillin and Vicodin [hydrocodone-acetaminophen]   Review of Systems Review of Systems  All other systems reviewed and are negative.    Physical Exam Updated Vital Signs BP (!) 152/87   Pulse 86   Temp 98.9 F (37.2 C) (Oral)   Resp 15   Ht 5\' 4"  (1.626 m)   Wt (!) 147.4 kg   SpO2 100%   BMI 55.79 kg/m   Physical Exam  Constitutional: She is oriented to person, place, and time. She appears well-developed and well-nourished.  HENT:  Head: Normocephalic and atraumatic.  Eyes: Pupils are equal, round, and reactive to light. Conjunctivae and EOM are normal.  Neck: Normal range of motion. Neck supple.  Cardiovascular: Normal rate and regular rhythm. Exam reveals  no gallop and no friction rub.  No murmur heard. Pulmonary/Chest: Effort normal and breath sounds normal. No respiratory distress. She has no wheezes. She has no rales. She exhibits no tenderness.  Abdominal: Soft. Bowel sounds are normal. She exhibits no distension and no mass. There is no tenderness. There is no rebound and no guarding.  Musculoskeletal: Normal range of motion. She exhibits no edema or tenderness.  No bony abnormality or deformity of the left upper extremity, no tenderness in the cervical spine  Neurological: She is alert and oriented to person, place, and time.  Sensation and strength in left upper extremity is 5/5  Skin: Skin is warm and dry.  Psychiatric: She has a normal mood and affect. Her behavior is  normal. Judgment and thought content normal.  Nursing note and vitals reviewed.    ED Treatments / Results  Labs (all labs ordered are listed, but only abnormal results are displayed) Labs Reviewed  BASIC METABOLIC PANEL - Abnormal; Notable for the following components:      Result Value   Glucose, Bld 112 (*)    Creatinine, Ser 1.03 (*)    All other components within normal limits  CBC - Abnormal; Notable for the following components:   RBC 5.73 (*)    Hemoglobin 15.3 (*)    HCT 48.4 (*)    All other components within normal limits  I-STAT TROPONIN, ED    EKG EKG Interpretation  Date/Time:  Friday June 11 2018 19:42:41 EDT Ventricular Rate:  81 PR Interval:  154 QRS Duration: 134 QT Interval:  428 QTC Calculation: 497 R Axis:   -72 Text Interpretation:  Normal sinus rhythm Possible Left atrial enlargement Right bundle branch block Left anterior fascicular block ** Bifascicular block ** Abnormal ECG Confirmed by Veryl Speak [phone]) on 06/12/2018 6:02:58 AM   Radiology No results found.  Procedures Procedures (including critical care time)  Medications Ordered in ED Medications - No data to display   Initial Impression / Assessment and Plan / ED Course  I have reviewed the triage vital signs and the nursing notes.  Pertinent labs & imaging results that were available during my care of the patient were reviewed by me and considered in my medical decision making (see chart for details).    Patient with left arm tightness.  She has had the symptoms for the past 2 days.  No weakness or numbness.  No injuries.  She states that she has had similar symptoms in the past after losing a loved one.  She lost another family member recently, and is concerned that this might be anxiety, but wanted to ensure that it was nothing else.  She does have MS, but has never had symptoms like this before.  Her troponin is negative.  EKG is unchanged from priors.  She has no chest pain  or shortness of breath.  Laboratory work-up is reassuring.  She is neurovascularly intact.  Recommend close follow-up with PCP.  Return precautions given.  Patient understands and agrees the plan.  Final Clinical Impressions(s) / ED Diagnoses   Final diagnoses:  Arm pain, anterior, unspecified laterality    ED Discharge Orders         Ordered    hydrOXYzine (ATARAX/VISTARIL) 25 MG tablet  Every 6 hours,   Status:  Discontinued     06/12/18 0041    hydrOXYzine (ATARAX/VISTARIL) 25 MG tablet  Every 6 hours     06/12/18 0041  Montine Circle, PA-C 06/12/18 0737    Veryl Speak, MD 06/12/18 [phone]

## 2018-06-12 NOTE — Discharge Instructions (Signed)
Please follow-up with your regular doctor.  Return to the ER for chest pain, shortness of breath, or weakness.

## 2018-06-12 NOTE — ED Notes (Signed)
Patient verbalizes understanding of discharge instructions. Opportunity for questioning and answers were provided. Pt discharged from ED. 

## 2018-06-15 DIAGNOSIS — F4310 Post-traumatic stress disorder, unspecified: Secondary | ICD-10-CM | POA: Diagnosis not present

## 2018-06-15 DIAGNOSIS — F410 Panic disorder [episodic paroxysmal anxiety] without agoraphobia: Secondary | ICD-10-CM | POA: Diagnosis not present

## 2018-06-15 DIAGNOSIS — Z23 Encounter for immunization: Secondary | ICD-10-CM | POA: Diagnosis not present

## 2018-07-08 ENCOUNTER — Ambulatory Visit: Payer: BLUE CROSS/BLUE SHIELD | Admitting: Internal Medicine

## 2018-07-08 ENCOUNTER — Encounter: Payer: Self-pay | Admitting: Internal Medicine

## 2018-07-08 VITALS — BP 156/100 | HR 73 | Ht 64.5 in | Wt 314.8 lb

## 2018-07-08 DIAGNOSIS — E119 Type 2 diabetes mellitus without complications: Secondary | ICD-10-CM | POA: Diagnosis not present

## 2018-07-08 DIAGNOSIS — G35 Multiple sclerosis: Secondary | ICD-10-CM | POA: Diagnosis not present

## 2018-07-08 DIAGNOSIS — R079 Chest pain, unspecified: Secondary | ICD-10-CM

## 2018-07-08 DIAGNOSIS — I452 Bifascicular block: Secondary | ICD-10-CM

## 2018-07-08 HISTORY — DX: Type 2 diabetes mellitus without complications: E11.9

## 2018-07-08 NOTE — Progress Notes (Signed)
Cardiology Office Note:    Date:  07/08/2018   ID:  Ann [name], DOB 26-Feb-1971, MRN [id number]  PCP:  Ann Small, MD  Cardiologist:  No primary care provider on file.  Electrophysiologist:  None   Referring MD: Ann Small, MD   History of Present Illness:    Ann [name] is a 47 y.o. female with a hx of diabetes mellitus type 2 on oral therapy and multiple sclerosis diagnosed by brain MR who presents today for concerns of an episode of left arm tightness and tingling in an ulnar nerve distribution on her left arm.  She presented to the emergency department in mid September with the symptoms.  This was shortly after the death of her mother, where she returned home from work one day and found her mother down.  Her mother's death was attributed to MI.  Ann [name] was uncertain if her symptoms were related to anxiety and grief or true cardiac pathology, so she presented to the emergency department.  She had a point-of-care troponin that was 0, and no concerning ECG changes for MI.  She has bifascicular block with right bundle branch block and left anterior fascicular block on ECG which has not changed since 2017.  She has never experienced chest pain or left arm discomfort previously, and currently denies chest discomfort, exertional shortness of breath, palpitations, PND, orthopnea, or leg swelling.  She does have dizzy spells, which she sought evaluation for and was diagnosed with multiple sclerosis by brain MRI.  She denies lifetime syncope.  Her family history is significant for her mother who had an MI at age 80, and her maternal aunt who had a stroke.   Past Medical History:  Diagnosis Date  . Diabetes mellitus without complication ([place])   . MS (multiple sclerosis) ([place])     Past Surgical History:  Procedure Laterality Date  . APPENDECTOMY    . CESAREAN SECTION      Current Medications: Current Meds  Medication Sig  . metFORMIN (GLUCOPHAGE) 500 MG tablet Take  500 mg by mouth 2 (two) times daily with a meal.  . norethindrone (JOLIVETTE) 0.35 MG tablet Take 1 tablet by mouth daily.  [name] DELICA LANCETS FINE MISC Use as directed  . ONETOUCH VERIO test strip Use as directed     Allergies:   Amoxicillin; Hydrocodone-acetaminophen; and Vicodin [hydrocodone-acetaminophen]   Social History   Socioeconomic History  . Marital status: Single    Spouse name: Not on file  . Number of children: Not on file  . Years of education: Not on file  . Highest education level: Not on file  Occupational History  . Occupation: Social worker  Social Needs  . Financial resource strain: Not on file  . Food insecurity:    Worry: Not on file    Inability: Not on file  . Transportation needs:    Medical: Not on file    Non-medical: Not on file  Tobacco Use  . Smoking status: Never Smoker  . Smokeless tobacco: Never Used  Substance and Sexual Activity  . Alcohol use: No  . Drug use: No  . Sexual activity: Not on file  Lifestyle  . Physical activity:    Days per week: Not on file    Minutes per session: Not on file  . Stress: Not on file  Relationships  . Social connections:    Talks on phone: Not on file    Gets together: Not on file    Attends  religious service: Not on file    Active member of club or organization: Not on file    Attends meetings of clubs or organizations: Not on file    Relationship status: Not on file  Other Topics Concern  . Not on file  Social History Narrative   Patient lives with mom in a one story home.  Has one daughter.  Works as an Social worker.  Education: BS     Family History: The patient's family history is as noted above.  ROS:   Please see the history of present illness.    All other systems reviewed and are negative.  EKGs/Labs/Other Studies Reviewed:    The following studies were reviewed today: ECG 06/14/2018, 06/12/2018, and 02/28/2016.  EKG:  EKG is ordered today.  The ekg ordered  today demonstrates normal sinus rhythm with right bundle branch block and left anterior fascicular block, ventricular rate 73.  Recent Labs: June 14, 2018: BUN 6; Creatinine, Ser 1.03; Hemoglobin 15.3; Platelets 270; Potassium 4.0; Sodium 140  Recent Lipid Panel No results found for: CHOL, TRIG, HDL, CHOLHDL, VLDL, LDLCALC, LDLDIRECT  Physical Exam:    VS:  BP (!) 156/100 (BP Location: Left Arm)   Pulse 73   Ht 5' 4.5" (1.638 m)   Wt (!) 314 lb 12.8 oz (142.8 kg)   BMI 53.20 kg/m     Wt Readings from Last 3 Encounters:  07/08/18 (!) 314 lb 12.8 oz (142.8 kg)  2018-06-14 (!) 325 lb (147.4 kg)  06/22/17 (!) 325 lb (147.4 kg)     GEN: Well nourished, well developed in no acute distress HEENT: Normal NECK: No JVD; No carotid bruits CARDIAC: Regular rhythm, normal rate, no murmurs.  She has splitting of S2, with deep inspiration heart sounds become distant, but there is no obvious paradoxical splitting.  Splitting appears fixed. RESPIRATORY:  Clear to auscultation without rales, wheezing or rhonchi  ABDOMEN: Soft, non-tender, non-distended MUSCULOSKELETAL:  No edema; No deformity  SKIN: Warm and dry NEUROLOGIC:  Alert and oriented x 3 PSYCHIATRIC:  Normal affect   ASSESSMENT:    1. Chest pain, unspecified type   2. Bifascicular block   3. Type 2 diabetes mellitus without complication, without long-term current use of insulin (HCC)    PLAN:    In order of problems listed above:  She describes left arm squeezing and discomfort, which may represent an anginal equivalent in a typical fashion.  She is quite worried about her heart.  Given her atypical presentation after grief we cannot rule out coronary artery disease with her risk factor of diabetes and conduction abnormality.  With this in mind we will obtain a treadmill sestamibi stress test.  If she does not perform to capacity on treadmill testing I would request the lab to convert her study to a regadenoson sestamibi pharmacologic  stress test.  I am somewhat concerned about the possibility of anterior wall breast attenuation on her nuclear images, we will keep this in mind with the interpretation.  There is no clear explanation for her conduction system abnormality, and I am concerned about possible structural heart disease or cardiomyopathy.  It is reassuring that her exam does not suggest heart failure today.  Nevertheless we will obtain an echocardiogram to ensure there is no underlying cardiomyopathic or structural process contributing to her bifascicular block.  She is experiencing dizzy spells which has been attributed to MS in the past.  However with her bifascicular block I have described to her in detail how we  rely on the intact posterior fascicle for best conduction.  I would want to ensure that her dizziness does not represent further slowed conduction, therefore we will obtain a 24-hour Holter monitor to evaluate for any advanced conduction system block.  We will see the patient back in 4 to 6 weeks to discuss further.  At that time we will obtain a repeat blood pressure; if it is elevated we will send her with an ambulatory blood pressure monitor to ensure that she has a diagnosis of hypertension and will consider treatment.   Medication Adjustments/Labs and Tests Ordered: Current medicines are reviewed at length with the patient today.  Concerns regarding medicines are outlined above.  No orders of the defined types were placed in this encounter.  No orders of the defined types were placed in this encounter.   There are no Patient Instructions on file for this visit.   Signed, Elouise Munroe, MD  07/08/2018 9:26 AM    [place]

## 2018-07-08 NOTE — Patient Instructions (Signed)
Medication Instructions:  Your physician recommends that you continue on your current medications as directed. Please refer to the Current Medication list given to you today.  If you need a refill on your cardiac medications before your next appointment, please call your pharmacy.   Lab work: None ordered  Testing/Procedures: Your physician has recommended that you wear a holter monitor. Holter monitors are medical devices that record the heart's electrical activity. Doctors most often use these monitors to diagnose arrhythmias. Arrhythmias are problems with the speed or rhythm of the heartbeat. The monitor is a small, portable device. You can wear one while you do your normal daily activities. This is usually used to diagnose what is causing palpitations/syncope (passing out).  Your physician has requested that you have an echocardiogram. Echocardiography is a painless test that uses sound waves to create images of your heart. It provides your doctor with information about the size and shape of your heart and how well your heart's chambers and valves are working. This procedure takes approximately one hour. There are no restrictions for this procedure.  Your physician has requested that you have en exercise stress myoview. For further information please visit [email or web address]. Please follow instruction sheet, as given. (To be scheduled at the [facility] location)    Follow-Up: At [facility], you and your health needs are our priority.  As part of our continuing mission to provide you with exceptional heart care, we have created designated Provider Care Teams.  These Care Teams include your primary Cardiologist (physician) and Advanced Practice Providers (APPs -  Physician Assistants and Nurse Practitioners) who all work together to provide you with the care you need, when you need it.  You will need a follow up appointment in 6 weeks with Dr.Acharya

## 2018-07-20 DIAGNOSIS — F410 Panic disorder [episodic paroxysmal anxiety] without agoraphobia: Secondary | ICD-10-CM | POA: Diagnosis not present

## 2018-07-20 DIAGNOSIS — I10 Essential (primary) hypertension: Secondary | ICD-10-CM | POA: Diagnosis not present

## 2018-07-20 DIAGNOSIS — F4310 Post-traumatic stress disorder, unspecified: Secondary | ICD-10-CM | POA: Diagnosis not present

## 2018-07-22 ENCOUNTER — Telehealth (HOSPITAL_COMMUNITY): Payer: Self-pay | Admitting: *Deleted

## 2018-07-22 NOTE — Telephone Encounter (Signed)
Left message on voicemail in reference to upcoming appointment scheduled for 07/27/18 Phone number given for a call back so details instructions can be given. Ann [name]

## 2018-07-26 ENCOUNTER — Telehealth (HOSPITAL_COMMUNITY): Payer: Self-pay | Admitting: *Deleted

## 2018-07-26 NOTE — Telephone Encounter (Signed)
Patient given detailed instructions per Myocardial Perfusion Study Information Sheet for the test on 07/27/18 at 1245. Patient notified to arrive 15 minutes early and that it is imperative to arrive on time for appointment to keep from having the test rescheduled.  If you need to cancel or reschedule your appointment, please call the office within 24 hours of your appointment. . Patient verbalized understanding.Shavonta Gossen, Ranae Palms

## 2018-07-27 ENCOUNTER — Ambulatory Visit (HOSPITAL_COMMUNITY): Payer: BLUE CROSS/BLUE SHIELD | Attending: Cardiovascular Disease

## 2018-07-27 ENCOUNTER — Other Ambulatory Visit: Payer: Self-pay | Admitting: Internal Medicine

## 2018-07-27 ENCOUNTER — Other Ambulatory Visit: Payer: Self-pay

## 2018-07-27 ENCOUNTER — Ambulatory Visit (INDEPENDENT_AMBULATORY_CARE_PROVIDER_SITE_OTHER): Payer: BLUE CROSS/BLUE SHIELD

## 2018-07-27 ENCOUNTER — Ambulatory Visit (HOSPITAL_BASED_OUTPATIENT_CLINIC_OR_DEPARTMENT_OTHER): Payer: BLUE CROSS/BLUE SHIELD

## 2018-07-27 DIAGNOSIS — I452 Bifascicular block: Secondary | ICD-10-CM | POA: Diagnosis not present

## 2018-07-27 DIAGNOSIS — R42 Dizziness and giddiness: Secondary | ICD-10-CM | POA: Diagnosis not present

## 2018-07-27 DIAGNOSIS — R079 Chest pain, unspecified: Secondary | ICD-10-CM | POA: Insufficient documentation

## 2018-07-27 MED ORDER — TECHNETIUM TC 99M TETROFOSMIN IV KIT
33.0000 | PACK | Freq: Once | INTRAVENOUS | Status: AC | PRN
  Administered 2018-07-27: 33 via INTRAVENOUS
  Filled 2018-07-27: qty 33

## 2018-07-28 ENCOUNTER — Ambulatory Visit (HOSPITAL_COMMUNITY): Payer: BLUE CROSS/BLUE SHIELD | Attending: Cardiovascular Disease

## 2018-07-28 LAB — MYOCARDIAL PERFUSION IMAGING
CHL CUP NUCLEAR SRS: 0
CHL CUP RESTING HR STRESS: 83 {beats}/min
LVDIAVOL: 62 mL (ref 46–106)
LVSYSVOL: 22 mL
NUC STRESS TID: 0.85
Peak HR: 151 {beats}/min
SDS: 1
SSS: 1

## 2018-07-28 MED ORDER — TECHNETIUM TC 99M TETROFOSMIN IV KIT
31.7000 | PACK | Freq: Once | INTRAVENOUS | Status: AC | PRN
  Filled 2018-07-28: qty 32

## 2018-07-29 ENCOUNTER — Telehealth: Payer: Self-pay

## 2018-07-29 NOTE — Telephone Encounter (Signed)
Called to give pt myoview and echo results. Lmtcb. Released to my chart

## 2018-07-29 NOTE — Telephone Encounter (Signed)
-----   Message from Elouise Munroe, MD sent at 07/28/2018  6:46 PM EDT ----- Normal pump function of the heart. No worrisome changes on echo.

## 2018-07-29 NOTE — Telephone Encounter (Signed)
Pt aware of myoview and echo results with verbal understanding. Pt voiced appreciation for the call.

## 2018-07-29 NOTE — Telephone Encounter (Signed)
-----   Message from Elouise Munroe, MD sent at 07/28/2018  5:48 PM EDT ----- Normal stress test

## 2018-07-30 DIAGNOSIS — N390 Urinary tract infection, site not specified: Secondary | ICD-10-CM | POA: Diagnosis not present

## 2018-08-11 ENCOUNTER — Telehealth: Payer: Self-pay

## 2018-08-11 NOTE — Telephone Encounter (Signed)
Pt aware of holter results with verbal understanding. Pt has a f/u appt on 11/22, pt sts that she will discuss if a prolonged monitor is needed at her upcoming appt.

## 2018-08-11 NOTE — Telephone Encounter (Signed)
-----   Message from Elouise Munroe, MD sent at 08/10/2018  4:32 PM EST ----- No worrisome findings, however she did not report a dizzy spell during the time she was wearing the monitor. If she would like a prolonged monitor (30 day monitor) we can arrange so as to better characterize her dizzy spells.

## 2018-08-20 ENCOUNTER — Encounter: Payer: Self-pay | Admitting: Internal Medicine

## 2018-08-20 ENCOUNTER — Ambulatory Visit: Payer: BLUE CROSS/BLUE SHIELD | Admitting: Internal Medicine

## 2018-08-20 VITALS — BP 140/80 | HR 74 | Ht 64.5 in | Wt 312.0 lb

## 2018-08-20 DIAGNOSIS — E119 Type 2 diabetes mellitus without complications: Secondary | ICD-10-CM | POA: Diagnosis not present

## 2018-08-20 DIAGNOSIS — I452 Bifascicular block: Secondary | ICD-10-CM

## 2018-08-20 DIAGNOSIS — R079 Chest pain, unspecified: Secondary | ICD-10-CM

## 2018-08-20 DIAGNOSIS — G35 Multiple sclerosis: Secondary | ICD-10-CM

## 2018-08-20 DIAGNOSIS — I10 Essential (primary) hypertension: Secondary | ICD-10-CM

## 2018-08-20 NOTE — Patient Instructions (Addendum)
Medication Instructions:  Your physician recommends that you continue on your current medications as directed. Please refer to the Current Medication list given to you today.   If you need a refill on your cardiac medications before your next appointment, please call your pharmacy.   Lab work: None ordered If you have labs (blood work) drawn today and your tests are completely normal, you will receive your results only by: Marland Kitchen MyChart Message (if you have MyChart) OR . A paper copy in the mail If you have any lab test that is abnormal or we need to change your treatment, we will call you to review the results.  Testing/Procedures: None ordered  Follow-Up: At [facility], you and your health needs are our priority.  As part of our continuing mission to provide you with exceptional heart care, we have created designated Provider Care Teams.  These Care Teams include your primary Cardiologist (physician) and Advanced Practice Providers (APPs -  Physician Assistants and Nurse Practitioners) who all work together to provide you with the care you need, when you need it. You will need a follow up appointment in 3 months.  Dr.Acharya or one of the following Advanced Practice Providers on your designated Care Team:   Rosaria Ferries, PA-C . Jory Sims, DNP, ANP  Any Other Special Instructions Will Be Listed Below (If Applicable).

## 2018-08-20 NOTE — Progress Notes (Signed)
Cardiology Office Note:    Date:  09/05/2018   ID:  Ann [name], DOB 1971-03-26, MRN [id number]  PCP:  Maurice Small, MD  Cardiologist:  No primary care provider on file.  Electrophysiologist:  None   Referring MD: Maurice Small, MD   Review of results; dizzy spells  History of Present Illness:    Ann [name] is a 47 y.o. female with a hx of diabetes mellitus type 2 on oral therapy and multiple sclerosis diagnosed by brain MR. she returns for review of test results.  Nuclear stress test was low risk and negative for ischemia, EF was 65%.  She exercised for 3 minutes on a Bruce protocol, stopped for fatigue, and had a peak blood pressure of 213/94 mmHg.  Her systolic blood pressure reveals a hypertensive response to exercise.  We have discussed this today.  Echocardiogram showed mild LVH but normal ejection fraction 60 to 65% and grade 1 diastolic dysfunction.  Her Holter monitor did not show evidence of high-grade AV block, however did demonstrate her baseline right bundle branch block with left anterior fascicular block.  She did not however record any dizzy symptoms in her diary, therefore we may not have appreciated an episode with the rhythm correlate.  She has no acute concerns today and is in good spirits.   Past Medical History:  Diagnosis Date  . Diabetes mellitus ([place]) 07/08/2018  . Diabetes mellitus without complication ([place])   . MS (multiple sclerosis) ([place])     Past Surgical History:  Procedure Laterality Date  . APPENDECTOMY    . CESAREAN SECTION      Current Medications: Current Meds  Medication Sig  . ALPRAZolam (XANAX) 0.25 MG tablet Take 0.25 mg by mouth 2 (two) times daily as needed.  . metFORMIN (GLUCOPHAGE) 500 MG tablet Take 500 mg by mouth 2 (two) times daily with a meal.  . norethindrone (JOLIVETTE) 0.35 MG tablet Take 1 tablet by mouth daily.  [name] DELICA LANCETS FINE MISC Use as directed  . ONETOUCH VERIO test strip Use as directed      Allergies:   Amoxicillin; Hydrocodone-acetaminophen; and Vicodin [hydrocodone-acetaminophen]   Social History   Socioeconomic History  . Marital status: Single    Spouse name: Not on file  . Number of children: Not on file  . Years of education: Not on file  . Highest education level: Not on file  Occupational History  . Occupation: Social worker  Social Needs  . Financial resource strain: Not on file  . Food insecurity:    Worry: Not on file    Inability: Not on file  . Transportation needs:    Medical: Not on file    Non-medical: Not on file  Tobacco Use  . Smoking status: Never Smoker  . Smokeless tobacco: Never Used  Substance and Sexual Activity  . Alcohol use: No  . Drug use: No  . Sexual activity: Not on file  Lifestyle  . Physical activity:    Days per week: Not on file    Minutes per session: Not on file  . Stress: Not on file  Relationships  . Social connections:    Talks on phone: Not on file    Gets together: Not on file    Attends religious service: Not on file    Active member of club or organization: Not on file    Attends meetings of clubs or organizations: Not on file    Relationship status: Not on file  Other Topics Concern  . Not on file  Social History Narrative   Patient lives with mom in a one story home.  Has one daughter.  Works as an Social worker.  Education: BS     Family History: The patient's family history is significant for her mother with coronary artery disease and likely sudden death from MI.  ROS:   Please see the history of present illness.    All other systems reviewed and are negative.  EKGs/Labs/Other Studies Reviewed:    The following studies were reviewed today:  EKG: Not obtained.  Recent Labs: 06/11/2018: BUN 6; Creatinine, Ser 1.03; Hemoglobin 15.3; Platelets 270; Potassium 4.0; Sodium 140  Recent Lipid Panel No results found for: CHOL, TRIG, HDL, CHOLHDL, VLDL, LDLCALC, LDLDIRECT  Physical  Exam:    VS:  BP 140/80   Pulse 74   Ht 5' 4.5" (1.638 m)   Wt (!) 312 lb (141.5 kg)   SpO2 98%   BMI 52.73 kg/m     Wt Readings from Last 3 Encounters:  08/20/18 (!) 312 lb (141.5 kg)  07/27/18 (!) 314 lb (142.4 kg)  07/08/18 (!) 314 lb 12.8 oz (142.8 kg)     Constitutional: No acute distress Eyes: pupils equally round and reactive to light, sclera non-icteric, normal conjunctiva and lids ENMT: normal dentition, moist mucous membranes Cardiovascular: regular rhythm, normal rate, no murmurs. S1 and S2 normal. Radial pulses normal bilaterally. No jugular venous distention.  Respiratory: clear to auscultation bilaterally GI : normal bowel sounds, soft and nontender. No distention.   MSK: extremities warm, well perfused. No edema.  NEURO: grossly nonfocal exam, moves all extremities. PSYCH: alert and oriented x 3, normal mood and affect.   ASSESSMENT:    1. Bifascicular block   2. Type 2 diabetes mellitus without complication, without long-term current use of insulin ([place])   3. Multiple sclerosis (HCC)   4. Chest pain, unspecified type    PLAN:    She had a hypertensive response to exercise and mild LVH on echo, and we discussed the likely need for medication therapy especially in the setting of type 2 diabetes.  She would be a good candidate for an ACE or an ARB given history of diabetes.  She would like to pursue lifestyle modification first, and we discussed diet, lifestyle, and exercise recommendations.  She may have significant benefit in her symptoms and her blood pressure with even modest weight loss.  She demonstrated understanding and we participated in shared decision making.  At our next follow-up visit we will discuss initiating antihypertensive therapy which will likely be necessary.  In the meantime we have discussed lifestyle modification goals.  I look forward to seeing her in follow-up in 3 months time.  Medication Adjustments/Labs and Tests Ordered: Current  medicines are reviewed at length with the patient today.  Concerns regarding medicines are outlined above.  No orders of the defined types were placed in this encounter.  No orders of the defined types were placed in this encounter.   Patient Instructions  Medication Instructions:  Your physician recommends that you continue on your current medications as directed. Please refer to the Current Medication list given to you today.   If you need a refill on your cardiac medications before your next appointment, please call your pharmacy.   Lab work: None ordered If you have labs (blood work) drawn today and your tests are completely normal, you will receive your results only by: Marland Kitchen MyChart Message (if you  have MyChart) OR . A paper copy in the mail If you have any lab test that is abnormal or we need to change your treatment, we will call you to review the results.  Testing/Procedures: None ordered  Follow-Up: At [facility], you and your health needs are our priority.  As part of our continuing mission to provide you with exceptional heart care, we have created designated Provider Care Teams.  These Care Teams include your primary Cardiologist (physician) and Advanced Practice Providers (APPs -  Physician Assistants and Nurse Practitioners) who all work together to provide you with the care you need, when you need it. You will need a follow up appointment in 3 months.  Dr.Mirakle Tomlin or one of the following Advanced Practice Providers on your designated Care Team:   Rosaria Ferries, PA-C . Jory Sims, DNP, ANP  Any Other Special Instructions Will Be Listed Below (If Applicable).       Signed, Elouise Munroe, MD  09/05/2018 10:08 AM    [place]

## 2018-10-25 DIAGNOSIS — F419 Anxiety disorder, unspecified: Secondary | ICD-10-CM | POA: Diagnosis not present

## 2018-10-26 ENCOUNTER — Ambulatory Visit
Admission: EM | Admit: 2018-10-26 | Discharge: 2018-10-26 | Disposition: A | Discharge status: Home / Self Care | Payer: BLUE CROSS/BLUE SHIELD | Attending: Nurse Practitioner | Admitting: Nurse Practitioner

## 2018-10-26 DIAGNOSIS — R42 Dizziness and giddiness: Secondary | ICD-10-CM | POA: Insufficient documentation

## 2018-10-26 HISTORY — DX: Anxiety disorder, unspecified: F41.9

## 2018-10-26 MED ORDER — MECLIZINE HCL 25 MG PO TABS: 25.0000 mg | ORAL_TABLET | Freq: Three times a day (TID) | ORAL | 0 refills | Status: DC | PRN

## 2018-10-26 NOTE — ED Triage Notes (Signed)
Pt c/o dizziness since yesterday. States went to [facility] and her blood pressure was high but lowered by the time she left. States having the same feeling now.

## 2018-10-26 NOTE — ED Provider Notes (Signed)
[facility]    CSN: [id number] Arrival date & time: 10/26/18  1329     History   Chief Complaint Chief Complaint  Patient presents with  . Dizziness    HPI Ann [name] is a 48 y.o. female.   Subjective:  Ann [name] is a 48 y.o. female who is here for evaluation of dizziness. The dizziness has been present for 2 days. She describes the symptoms as lightheadedness. Symptoms are exacerbated by none identified. She denies otalgia, tinnitus, facial/limb paresthesias, chest pain, shortness of breath, nausea, vomiting, blurred vision or headache.  No recent URI or GI illness.  No prior history of vertigo.  Dizziness occurs in episodes.  Lasts about 20 to 30 minutes.  Resolves with rest.  She denies any precipitating factors.  Notably, patient had a similar episode about a year ago. She was evaluated in the ED and was told that she was dehydrated.  She received IV fluids and felt better.  Patient is eating and drinking without difficulty.  Her mom passed away in 2018/05/31 from a heart attack.  The patient went to a cardiologist in November to "get a checkup" because she wanted to evaluate her personal risk factors for a heart attack.  He has had an EKG, stress test and other cardiac screening which she reports have been unremarkable.  She has a routine follow-up appointment with her cardiologist scheduled for February 13.  The following portions of the patient's history were reviewed and updated as appropriate: allergies, current medications, past family history, past medical history, past social history, past surgical history and problem list.          Past Medical History:  Diagnosis Date  . Anxiety   . Diabetes mellitus ([place]) 07/08/2018  . Diabetes mellitus without complication ([place])   . MS (multiple sclerosis) ([place])     Patient Active Problem List   Diagnosis Date Noted  . Diabetes mellitus ([place]) 07/08/2018    Past Surgical History:  Procedure  Laterality Date  . APPENDECTOMY    . CESAREAN SECTION      OB History   No obstetric history on file.      Home Medications    Prior to Admission medications   Medication Sig Start Date End Date Taking? Authorizing Provider  ALPRAZolam (XANAX) 0.25 MG tablet Take 0.25 mg by mouth 2 (two) times daily as needed. 06/15/18   [provider]  meclizine (ANTIVERT) 25 MG tablet Take 1 tablet (25 mg total) by mouth 3 (three) times daily as needed for dizziness. 10/26/18   Enrique Sack, FNP  metFORMIN (GLUCOPHAGE) 500 MG tablet Take 500 mg by mouth 2 (two) times daily with a meal.    [provider]  norethindrone (JOLIVETTE) 0.35 MG tablet Take 1 tablet by mouth daily.    [provider]  Jonetta Speak LANCETS FINE MISC Use as directed 02/15/16   [provider]  Roma Schanz test strip Use as directed 02/15/16   [provider]    Family History No family history on file.  Social History Social History   Tobacco Use  . Smoking status: Never Smoker  . Smokeless tobacco: Never Used  Substance Use Topics  . Alcohol use: No  . Drug use: No     Allergies   Amoxicillin; Hydrocodone-acetaminophen; and Vicodin [hydrocodone-acetaminophen]   Review of Systems Review of Systems  Constitutional: Negative.   Respiratory: Negative.   Cardiovascular: Negative.   Gastrointestinal: Negative.   Genitourinary:  Negative.   Skin: Negative.   Neurological: Positive for dizziness.  All other systems reviewed and are negative.    Physical Exam Triage Vital Signs ED Triage Vitals [10/26/18 1343]  Enc Vitals Group     BP 124/86     Pulse Rate 100     Resp 18     Temp 98.3 F (36.8 C)     Temp Source Oral     SpO2 95 %     Weight      Height      Head Circumference      Peak Flow      Pain Score 0     Pain Loc      Pain Edu?      Excl. in [place]?    No data found.  Updated Vital Signs BP 124/86 (BP Location: Left Arm)   Pulse  100   Temp 98.3 F (36.8 C) (Oral)   Resp 18   SpO2 95%   Visual Acuity Right Eye Distance:   Left Eye Distance:   Bilateral Distance:    Right Eye Near:   Left Eye Near:    Bilateral Near:     Physical Exam Vitals signs reviewed.  Constitutional:      Appearance: Normal appearance.  HENT:     Head: Normocephalic.     Right Ear: Tympanic membrane, ear canal and external ear normal.     Left Ear: Tympanic membrane, ear canal and external ear normal.     Nose: Nose normal.     Mouth/Throat:     Mouth: Mucous membranes are moist.     Pharynx: Oropharynx is clear.  Eyes:     Extraocular Movements: Extraocular movements intact.     Conjunctiva/sclera: Conjunctivae normal.     Pupils: Pupils are equal, round, and reactive to light.  Neck:     Musculoskeletal: Normal range of motion and neck supple. No neck rigidity.  Cardiovascular:     Rate and Rhythm: Normal rate and regular rhythm.     Pulses: Normal pulses.  Pulmonary:     Effort: Pulmonary effort is normal.  Musculoskeletal: Normal range of motion.  Skin:    General: Skin is warm and dry.  Neurological:     General: No focal deficit present.     Mental Status: She is alert and oriented to person, place, and time.     GCS: GCS eye subscore is 4. GCS verbal subscore is 5. GCS motor subscore is 6.     Cranial Nerves: Cranial nerves are intact. No cranial nerve deficit.     Sensory: Sensation is intact. No sensory deficit.     Motor: Motor function is intact.     Coordination: Coordination is intact.     Gait: Gait is intact.     Comments: Negative dix-hallpike   Psychiatric:        Mood and Affect: Mood normal.      UC Treatments / Results  Labs (all labs ordered are listed, but only abnormal results are displayed) Labs Reviewed  COMPREHENSIVE METABOLIC PANEL  CBC    EKG None  Radiology No results found.  Procedures Procedures (including critical care time)  Medications Ordered in UC Medications  - No data to display  Initial Impression / Assessment and Plan / UC Course  I have reviewed the triage vital signs and the nursing notes.  Pertinent labs & imaging results that were available during my care of the patient were reviewed by  me and considered in my medical decision making (see chart for details).     48 year old female with a history of anxiety and diabetes presenting with a 2-day history of episodic dizziness.  No signs stable.  EKG shows normal sinus rhythm with right bundle branch block.  CBC and comprehensive metabolic panel drawn and results pending.  Patient is alert and oriented x3.  Physical exam unremarkable.  No focal neurologic deficits noted.  Will discharge home with close outpatient follow-up.  Meclizine as needed for dizziness.  Patient advised to follow-up with her PCP next week and her cardiologist on February 13 as scheduled. To ED immediately if symptoms worsen or for anything else of concern.  Patient agreeable.  Today's evaluation has revealed no signs of a dangerous process. Discussed diagnosis with patient. Patient aware of their diagnosis, possible red flag symptoms to watch out for and need for close follow up. Patient understands verbal and written discharge instructions. Patient comfortable with plan and disposition.  Patient has a clear mental status at this time, good insight into illness (after discussion and teaching) and has clear judgment to make decisions regarding their care.  Documentation was completed with the aid of voice recognition software. Transcription may contain typographical errors. Final Clinical Impressions(s) / UC Diagnoses   Final diagnoses:  Dizzinesses     Discharge Instructions     Take medications as needed for dizziness.  Drink plenty of fluids.  Limit your salt and caffeine intake.  Avoid making quick movements.  You may go onto MyChart in the next couple of days to view the results of your blood work that we took today.   Follow-up with your primary care provider next week.  Follow-up with your cardiologist on February 13 as already scheduled.  Go to the ED immediately if your symptoms get worse.     ED Prescriptions    Medication Sig Dispense Auth. Provider   meclizine (ANTIVERT) 25 MG tablet Take 1 tablet (25 mg total) by mouth 3 (three) times daily as needed for dizziness. 30 tablet Enrique Sack, FNP     Controlled Substance Prescriptions [place] Controlled Substance Registry consulted? Not Applicable   Enrique Sack, [place] 10/26/18 1526

## 2018-10-26 NOTE — Discharge Instructions (Signed)
Take medications as needed for dizziness.  Drink plenty of fluids.  Limit your salt and caffeine intake.  Avoid making quick movements.  You may go onto MyChart in the next couple of days to view the results of your blood work that we took today.  Follow-up with your primary care provider next week.  Follow-up with your cardiologist on February 13 as already scheduled.  Go to the ED immediately if your symptoms get worse.

## 2018-11-11 ENCOUNTER — Encounter: Payer: Self-pay | Admitting: Internal Medicine

## 2018-11-11 ENCOUNTER — Ambulatory Visit: Payer: BLUE CROSS/BLUE SHIELD | Admitting: Internal Medicine

## 2018-11-11 VITALS — BP 148/93 | HR 87 | Ht 64.0 in | Wt 310.0 lb

## 2018-11-11 DIAGNOSIS — I10 Essential (primary) hypertension: Secondary | ICD-10-CM

## 2018-11-11 DIAGNOSIS — R42 Dizziness and giddiness: Secondary | ICD-10-CM | POA: Diagnosis not present

## 2018-11-11 DIAGNOSIS — Z5181 Encounter for therapeutic drug level monitoring: Secondary | ICD-10-CM | POA: Diagnosis not present

## 2018-11-11 DIAGNOSIS — R002 Palpitations: Secondary | ICD-10-CM

## 2018-11-11 DIAGNOSIS — E119 Type 2 diabetes mellitus without complications: Secondary | ICD-10-CM

## 2018-11-11 DIAGNOSIS — G35 Multiple sclerosis: Secondary | ICD-10-CM

## 2018-11-11 MED ORDER — LOSARTAN POTASSIUM 25 MG PO TABS: 25.0000 mg | ORAL_TABLET | Freq: Every day | ORAL | 3 refills | Status: DC

## 2018-11-11 NOTE — Progress Notes (Signed)
Cardiology Office Note:    Date:  11/11/2018   ID:  ANTONAE [name], DOB [birth year], MRN [id number]  PCP:  Maurice Small, MD  Cardiologist:  No primary care provider on file.  Electrophysiologist:  None   Referring MD: Maurice Small, MD   Hypertension and dizziness  History of Present Illness:    Ann [name] is a 48 y.o. female with a hx of anxiety, diabetes, multiple sclerosis, and hypertension who presents today for follow-up at my request.  She continues to have dizziness which was her presenting symptom at our initial consultation and was recently seen in urgent care.  No concerning findings were noted however her lab work is still pending from that visit and has been requested by our office.  She is an established patient in my clinic and we have performed a Holter monitor as well as a stress test at our previous encounter.  We had a discussion at her last visit about a hypertensive response to exercise and low exercise capacity.  I had offered that she could pursue diet and lifestyle modification for blood pressure and if her blood pressure reflected better today we would discuss continued lifestyle modification.  However her blood pressure remains elevated today at our visit 148/93 mmHg, and I believe antihypertensive therapy is warranted at this time.  She does also have mild LVH on echo suggesting further that treatment of hypertension is warranted.  She continues to have dizzy spells and unfortunately when she had her Holter monitor she did not have any symptoms of dizziness.  She tells me that the dizziness can occur 3 times a week at the most, and we discussed placing an event monitor to fully rule out a cardiovascular origin of her dizziness.  She also endorses a sensation of "shock" in her chest occasionally, suggesting palpitations.  She does have an MRI diagnosis of multiple sclerosis and follows with her primary care physician for this.  She has been evaluated for  medication therapy for this in the past but has had long discussions with her neurologist and primary care physician and is not keen to take medications for MS at this time.  The patient denies chest pain, chest pressure, dyspnea at rest or with exertion, PND, orthopnea, or leg swelling. Denies syncope or presyncope.   Past Medical History:  Diagnosis Date  . Anxiety   . Diabetes mellitus ([place]) 07/08/2018  . Diabetes mellitus without complication ([place])   . MS (multiple sclerosis) ([place])     Past Surgical History:  Procedure Laterality Date  . APPENDECTOMY    . CESAREAN SECTION      Current Medications: Current Meds  Medication Sig  . ALPRAZolam (XANAX) 0.25 MG tablet Take 0.25 mg by mouth 2 (two) times daily as needed.  . meclizine (ANTIVERT) 25 MG tablet Take 1 tablet (25 mg total) by mouth 3 (three) times daily as needed for dizziness.  . metFORMIN (GLUCOPHAGE) 500 MG tablet Take 500 mg by mouth 2 (two) times daily with a meal.  . norethindrone (JOLIVETTE) 0.35 MG tablet Take 1 tablet by mouth daily.  [name] DELICA LANCETS FINE MISC Use as directed  . ONETOUCH VERIO test strip Use as directed     Allergies:   Amoxicillin; Hydrocodone-acetaminophen; and Vicodin [hydrocodone-acetaminophen]   Social History   Socioeconomic History  . Marital status: Single    Spouse name: Not on file  . Number of children: Not on file  . Years of education: Not on file  .  Highest education level: Not on file  Occupational History  . Occupation: Social worker  Social Needs  . Financial resource strain: Not on file  . Food insecurity:    Worry: Not on file    Inability: Not on file  . Transportation needs:    Medical: Not on file    Non-medical: Not on file  Tobacco Use  . Smoking status: Never Smoker  . Smokeless tobacco: Never Used  Substance and Sexual Activity  . Alcohol use: No  . Drug use: No  . Sexual activity: Not on file  Lifestyle  . Physical activity:     Days per week: Not on file    Minutes per session: Not on file  . Stress: Not on file  Relationships  . Social connections:    Talks on phone: Not on file    Gets together: Not on file    Attends religious service: Not on file    Active member of club or organization: Not on file    Attends meetings of clubs or organizations: Not on file    Relationship status: Not on file  Other Topics Concern  . Not on file  Social History Narrative   Patient lives with mom in a one story home.  Has one daughter.  Works as an Social worker.  Education: BS     Family History: The patient's family history includes Heart attack in her mother; Sudden death in her mother.  ROS:   Please see the history of present illness.    All other systems reviewed and are negative.  EKGs/Labs/Other Studies Reviewed:    The following studies were reviewed today:  EKG: Not performed today  Recent Labs: 06/11/2018: BUN 6; Creatinine, Ser 1.03; Hemoglobin 15.3; Platelets 270; Potassium 4.0; Sodium 140  Recent Lipid Panel No results found for: CHOL, TRIG, HDL, CHOLHDL, VLDL, LDLCALC, LDLDIRECT  Physical Exam:    VS:  BP (!) 148/93   Pulse 87   Ht 5\' 4"  (1.626 m)   Wt (!) 310 lb (140.6 kg)   BMI 53.21 kg/m     Wt Readings from Last 3 Encounters:  11/11/18 (!) 310 lb (140.6 kg)  08/20/18 (!) 312 lb (141.5 kg)  07/27/18 (!) 314 lb (142.4 kg)     Constitutional: No acute distress Eyes: pupils equally round and reactive to light, sclera non-icteric, normal conjunctiva and lids ENMT: normal dentition, moist mucous membranes Cardiovascular: regular rhythm, normal rate, no murmurs. S1 and S2 normal. Radial pulses normal bilaterally. No jugular venous distention.  Respiratory: clear to auscultation bilaterally GI : normal bowel sounds, soft and nontender. No distention.   MSK: extremities warm, well perfused. No edema.  NEURO: grossly nonfocal exam, moves all extremities. PSYCH: alert and oriented x  3, normal mood and affect.   ASSESSMENT:    1. Palpitations 2. Dizziness 3. Therapeutic drug monitoring 4. Type 2 diabetes mellitus without complication, without long-term current use of insulin ([place]) 5. Multiple sclerosis ([place]) 6. Essential hypertension  PLAN:    Essential hypertension -she has demonstrated both resting hypertension as well as exercise hypertension with minimal exercise, and I believe medication therapy is warranted at this time.  We will selected losartan 25 mg daily considering that the patient is also diabetic.  We will plan for bmet in a week to evaluate for creatinine in the setting of initiation of losartan.  At that time if her blood pressures are still elevated when we speak to her, we should consider up  titrating the dose of losartan if creatinine is stable.  With regard to continued dizziness and palpitations, we will plan for an event monitor.  She has symptoms approximately 3 times a week, and I believe an event monitor will definitively rule out any cardiovascular origin of her dizziness.  I have discussed with the patient that if her dizziness does not seem cardiac in origin, she may want to consider revisiting a neurologist regarding her multiple sclerosis.  She can discuss this further with her primary care physician Dr. Justin Mend.  Type 2 diabetes mellitus without complication, without long-term current use of insulin ([place]) -last A1c on file is 6.9.  Per primary care.  Hyperlipidemia- we will need to consider addition of lipid-lowering therapy.  This can be performed by her primary care physician at the next visit, or at our follow-up visit.  The patient is not keen to take many medications, however in the setting of her diabetes and elevated cholesterol with an LDL of 120 and triglycerides of 169, lipid-lowering therapy is recommended.  Consider the addition of atorvastatin 20 mg daily.    Medication Adjustments/Labs and Tests Ordered: Current medicines are  reviewed at length with the patient today.  Concerns regarding medicines are outlined above.  Orders Placed This Encounter  Procedures  . Basic metabolic panel  . CARDIAC EVENT MONITOR   Meds ordered this encounter  Medications  . losartan (COZAAR) 25 MG tablet    Sig: Take 1 tablet (25 mg total) by mouth daily.    Dispense:  90 tablet    Refill:  3    Patient Instructions  Medication Instructions:  START LOSARTAN 25 MG DAILY   If you need a refill on your cardiac medications before your next appointment, please call your pharmacy.   Lab work: BMET IN 1 WEEK   If you have labs (blood work) drawn today and your tests are completely normal, you will receive your results only by: Marland Kitchen MyChart Message (if you have MyChart) OR . A paper copy in the mail If you have any lab test that is abnormal or we need to change your treatment, we will call you to review the results.  Testing/Procedures: Your physician has recommended that you wear a holter monitor. Holter monitors are medical devices that record the heart's electrical activity. Doctors most often use these monitors to diagnose arrhythmias. Arrhythmias are problems with the speed or rhythm of the heartbeat. The monitor is a small, portable device. You can wear one while you do your normal daily activities. This is usually used to diagnose what is causing palpitations/syncope (passing out). [place] [address]  Follow-Up: At [facility], you and your health needs are our priority.  As part of our continuing mission to provide you with exceptional heart care, we have created designated Provider Care Teams.  These Care Teams include your primary Cardiologist (physician) and Advanced Practice Providers (APPs -  Physician Assistants and Nurse Practitioners) who all work together to provide you with the care you need, when you need it. You will need a follow up appointment in 3 months. You may see DR Margaretann Loveless or one of  the following Advanced Practice Providers on your designated Care Team:   Rosaria Ferries, PA-C . Jory Sims, DNP, ANP  Any Other Special Instructions Will Be Listed Below (If Applicable). MONITOR YOUR BLOOD PRESSURE AT HOME, GIVE UPDATE WHEN WE CALL TO REPORT YOUR LABS      Signed, Geanie Pacifico A  Margaretann Loveless, MD  11/11/2018 9:55 AM    [place]

## 2018-11-11 NOTE — Patient Instructions (Addendum)
Medication Instructions:  START LOSARTAN 25 MG DAILY   If you need a refill on your cardiac medications before your next appointment, please call your pharmacy.   Lab work: BMET IN 1 WEEK   If you have labs (blood work) drawn today and your tests are completely normal, you will receive your results only by: Marland Kitchen MyChart Message (if you have MyChart) OR . A paper copy in the mail If you have any lab test that is abnormal or we need to change your treatment, we will call you to review the results.  Testing/Procedures: Your physician has recommended that you wear a holter monitor. Holter monitors are medical devices that record the heart's electrical activity. Doctors most often use these monitors to diagnose arrhythmias. Arrhythmias are problems with the speed or rhythm of the heartbeat. The monitor is a small, portable device. You can wear one while you do your normal daily activities. This is usually used to diagnose what is causing palpitations/syncope (passing out). [place] [address]  Follow-Up: At [facility], you and your health needs are our priority.  As part of our continuing mission to provide you with exceptional heart care, we have created designated Provider Care Teams.  These Care Teams include your primary Cardiologist (physician) and Advanced Practice Providers (APPs -  Physician Assistants and Nurse Practitioners) who all work together to provide you with the care you need, when you need it. You will need a follow up appointment in 3 months. You may see DR Margaretann Loveless or one of the following Advanced Practice Providers on your designated Care Team:   Rosaria Ferries, PA-C . Jory Sims, DNP, ANP  Any Other Special Instructions Will Be Listed Below (If Applicable). MONITOR YOUR BLOOD PRESSURE AT HOME, GIVE UPDATE WHEN WE CALL TO REPORT YOUR LABS

## 2018-11-12 ENCOUNTER — Ambulatory Visit
Admission: EM | Admit: 2018-11-12 | Discharge: 2018-11-12 | Disposition: A | Discharge status: Home / Self Care | Payer: BLUE CROSS/BLUE SHIELD | Attending: Physician Assistant | Admitting: Physician Assistant

## 2018-11-12 ENCOUNTER — Encounter: Payer: Self-pay | Admitting: Emergency Medicine

## 2018-11-12 DIAGNOSIS — H6982 Other specified disorders of Eustachian tube, left ear: Secondary | ICD-10-CM | POA: Diagnosis not present

## 2018-11-12 NOTE — Discharge Instructions (Signed)
Start Flonase or Nasacort, 2 sprays in each nostril once a day for the next 1 to 2 weeks.  You can also start allergy medicine.  Follow-up with PCP/ENT if symptoms do not improving.

## 2018-11-12 NOTE — ED Triage Notes (Signed)
Pt presents to [facility] for assessment of left ear pain, "clogged up" since yesterday

## 2018-11-12 NOTE — ED Notes (Signed)
Patient able to ambulate independently  

## 2018-11-12 NOTE — ED Provider Notes (Signed)
[facility]    CSN: [id number] Arrival date & time: 11/12/18  1320     History   Chief Complaint Chief Complaint  Patient presents with  . Otalgia    HPI Ann [name] is a 48 y.o. female.   48 year old female comes in for 1 day history of muffled hearing, left ear pressure. States did have intermittent pain to the left ear. She denies ear drainage. Denies URI symptoms such as cough, congestion, sore throat. Denies fever, chills, night sweats. States she is frequently on the phone for her job, and has not been able to hear as well for the left ear. She was seen for dizziness 2 weeks ago, and saw her cardiologist yesterday for dizziness follow up. She plans to have an event monitor for the dizziness. She denies dizziness worsening since current symptom onset.      Past Medical History:  Diagnosis Date  . Anxiety   . Diabetes mellitus ([place]) 07/08/2018  . Diabetes mellitus without complication ([place])   . MS (multiple sclerosis) ([place])     Patient Active Problem List   Diagnosis Date Noted  . Diabetes mellitus ([place]) 07/08/2018    Past Surgical History:  Procedure Laterality Date  . APPENDECTOMY    . CESAREAN SECTION      OB History   No obstetric history on file.      Home Medications    Prior to Admission medications   Medication Sig Start Date End Date Taking? Authorizing Provider  ALPRAZolam (XANAX) 0.25 MG tablet Take 0.25 mg by mouth 2 (two) times daily as needed. 06/15/18  Yes [provider]  losartan (COZAAR) 25 MG tablet Take 1 tablet (25 mg total) by mouth daily. 11/11/18 02/09/19 Yes Elouise Munroe, MD  meclizine (ANTIVERT) 25 MG tablet Take 1 tablet (25 mg total) by mouth 3 (three) times daily as needed for dizziness. 10/26/18  Yes Enrique Sack, FNP  metFORMIN (GLUCOPHAGE) 500 MG tablet Take 500 mg by mouth 2 (two) times daily with a meal.   Yes [provider]  norethindrone (JOLIVETTE) 0.35 MG tablet Take 1  tablet by mouth daily.   Yes [provider]  Jonetta Speak LANCETS FINE MISC Use as directed 02/15/16   [provider]  Roma Schanz test strip Use as directed 02/15/16   [provider]    Family History Family History  Problem Relation Age of Onset  . Heart attack Mother   . Sudden death Mother     Social History Social History   Tobacco Use  . Smoking status: Never Smoker  . Smokeless tobacco: Never Used  Substance Use Topics  . Alcohol use: No  . Drug use: No     Allergies   Amoxicillin; Hydrocodone-acetaminophen; and Vicodin [hydrocodone-acetaminophen]   Review of Systems Review of Systems  Reason unable to perform ROS: See HPI as above.     Physical Exam Triage Vital Signs ED Triage Vitals  Enc Vitals Group     BP 11/12/18 1328 121/82     Pulse Rate 11/12/18 1328 78     Resp 11/12/18 1328 18     Temp 11/12/18 1328 97.7 F (36.5 C)     Temp Source 11/12/18 1328 Oral     SpO2 11/12/18 1328 98 %     Weight --      Height --      Head Circumference --      Peak Flow --  Pain Score 11/12/18 1330 6     Pain Loc --      Pain Edu? --      Excl. in [place]? --    No data found.  Updated Vital Signs BP 121/82 (BP Location: Left Arm)   Pulse 78   Temp 97.7 F (36.5 C) (Oral)   Resp 18   SpO2 98%   Visual Acuity Right Eye Distance:   Left Eye Distance:   Bilateral Distance:    Right Eye Near:   Left Eye Near:    Bilateral Near:     Physical Exam Constitutional:      General: She is not in acute distress.    Appearance: She is well-developed. She is not ill-appearing, toxic-appearing or diaphoretic.  HENT:     Head: Normocephalic and atraumatic.     Right Ear: Tympanic membrane, ear canal and external ear normal. Tympanic membrane is not erythematous or bulging.     Left Ear: Ear canal and external ear normal.     Ears:     Comments: Cerumen in left ear canal, TM partially visible, no obvious erythema.  Unable to  assess bulging/middle ear effusion due to cerumen.  Post ear irrigation, TM fully visible.  Mild erythema without bulging.  TM opaque.  No obvious middle ear effusion.    Nose: Nose normal.     Right Sinus: No maxillary sinus tenderness or frontal sinus tenderness.     Left Sinus: No maxillary sinus tenderness or frontal sinus tenderness.     Mouth/Throat:     Mouth: Mucous membranes are moist.     Pharynx: Oropharynx is clear. Uvula midline.  Eyes:     Conjunctiva/sclera: Conjunctivae normal.     Pupils: Pupils are equal, round, and reactive to light.  Neck:     Musculoskeletal: Normal range of motion and neck supple.  Cardiovascular:     Rate and Rhythm: Normal rate and regular rhythm.     Heart sounds: Normal heart sounds. No murmur. No friction rub. No gallop.   Pulmonary:     Effort: Pulmonary effort is normal.     Breath sounds: Normal breath sounds. No decreased breath sounds, wheezing, rhonchi or rales.  Lymphadenopathy:     Cervical: No cervical adenopathy.  Skin:    General: Skin is warm and dry.  Neurological:     Mental Status: She is alert and oriented to person, place, and time.  Psychiatric:        Behavior: Behavior normal.        Judgment: Judgment normal.      UC Treatments / Results  Labs (all labs ordered are listed, but only abnormal results are displayed) Labs Reviewed - No data to display    EKG None  Radiology No results found.  Procedures Procedures (including critical care time)  Medications Ordered in UC Medications - No data to display  Initial Impression / Assessment and Plan / UC Course  I have reviewed the triage vital signs and the nursing notes.  Pertinent labs & imaging results that were available during my care of the patient were reviewed by me and considered in my medical decision making (see chart for details).    Will treat for eustachian tube dysfunction.  Nasal corticosteroid as directed.  Push fluids.  Return  precautions given.  Patient expresses understanding and agrees to plan.  Patient had CBC and CMP drawn 10/26/2018, results unable to be transferred over.  Results obtained through phone call.  Discussed results with patient, creatinine elevated from 1.02 June 1999 19-1.6.  Patient to increase fluid intake, recheck with cardiology in 1 week as scheduled.  Return precautions given.  CBC WBC 5.8 RBC 5.16 Hgb 14.2 HCT 42.8 MCB 83 MCH 27.5 MCHC 33.2 RDW 13.7 Plat 292  CMP Glucose 134 BUN 10 Creatinine 1.6 GFR 62 GFRA 72 BUN/creatinine 9 Na 140 K 4.0 Chl 100 CO2 24 Cal 8.7 TP RT 7.5 Albumin 4.1 Glob 3.4 Ratio 1.2 Bili 0.5 Alk phos 81 Sgot 11 SDPT 10  Final Clinical Impressions(s) / UC Diagnoses   Final diagnoses:  Dysfunction of left eustachian tube    ED Prescriptions    None        Ok Edwards, PA-C 11/12/18 1440

## 2018-11-24 ENCOUNTER — Other Ambulatory Visit: Payer: BLUE CROSS/BLUE SHIELD | Admitting: *Deleted

## 2018-11-24 ENCOUNTER — Ambulatory Visit (INDEPENDENT_AMBULATORY_CARE_PROVIDER_SITE_OTHER): Payer: BLUE CROSS/BLUE SHIELD

## 2018-11-24 DIAGNOSIS — R002 Palpitations: Secondary | ICD-10-CM

## 2018-11-24 DIAGNOSIS — R42 Dizziness and giddiness: Secondary | ICD-10-CM

## 2018-11-24 DIAGNOSIS — Z5181 Encounter for therapeutic drug level monitoring: Secondary | ICD-10-CM | POA: Diagnosis not present

## 2018-11-24 LAB — BASIC METABOLIC PANEL
BUN/Creatinine Ratio: 11 (ref 9–23)
BUN: 10 mg/dL (ref 6–24)
CALCIUM: 8.8 mg/dL (ref 8.7–10.2)
CHLORIDE: 98 mmol/L (ref 96–106)
CO2: 24 mmol/L (ref 20–29)
Creatinine, Ser: 0.9 mg/dL (ref 0.57–1.00)
GFR calc Af Amer: 87 mL/min/{1.73_m2} (ref >59)
GFR calc non Af Amer: 75 mL/min/{1.73_m2} (ref >59)
Glucose: 118 mg/dL — ABNORMAL HIGH (ref 65–99)
Potassium: 4.1 mmol/L (ref 3.5–5.2)
Sodium: 138 mmol/L (ref 134–144)

## 2019-01-21 ENCOUNTER — Telehealth: Payer: Self-pay | Admitting: *Deleted

## 2019-01-21 NOTE — Telephone Encounter (Signed)
   TELEPHONE CALL NOTE  This patient has been deemed a candidate for follow-up tele-health visit to limit community exposure during the Covid-19 pandemic. I spoke with the patient via phone to discuss instructions.  The patient will receive a phone call 2-3 days prior to their E-Visit at which time consent will be verbally confirmed.   A Virtual Office Visit appointment type has been scheduled for 5/13 with Margaretann Loveless, patient prefers  DOXY /TELEPHONE type.  I have e confirmed the patient is active in MyChart Raiford Simmonds, [place] 01/21/2019 11:28 AM

## 2019-01-28 DIAGNOSIS — R300 Dysuria: Secondary | ICD-10-CM | POA: Diagnosis not present

## 2019-02-08 ENCOUNTER — Telehealth: Payer: Self-pay | Admitting: Internal Medicine

## 2019-02-08 NOTE — Telephone Encounter (Signed)
Smartphone/ consent/ my chart active/ pre reg completed °

## 2019-02-09 ENCOUNTER — Telehealth (INDEPENDENT_AMBULATORY_CARE_PROVIDER_SITE_OTHER): Payer: BLUE CROSS/BLUE SHIELD | Admitting: Internal Medicine

## 2019-02-09 ENCOUNTER — Encounter: Payer: Self-pay | Admitting: Internal Medicine

## 2019-02-09 VITALS — BP 117/77 | HR 71 | Ht 64.0 in | Wt 305.0 lb

## 2019-02-09 DIAGNOSIS — R42 Dizziness and giddiness: Secondary | ICD-10-CM

## 2019-02-09 DIAGNOSIS — I10 Essential (primary) hypertension: Secondary | ICD-10-CM

## 2019-02-09 DIAGNOSIS — E119 Type 2 diabetes mellitus without complications: Secondary | ICD-10-CM

## 2019-02-09 DIAGNOSIS — G35 Multiple sclerosis: Secondary | ICD-10-CM

## 2019-02-09 DIAGNOSIS — I452 Bifascicular block: Secondary | ICD-10-CM

## 2019-02-09 NOTE — Patient Instructions (Signed)
Medication Instructions:  Your physician recommends that you continue on your current medications as directed. Please refer to the Current Medication list given to you today.  If you need a refill on your cardiac medications before your next appointment, please call your pharmacy.    Follow-Up: At [facility], you and your health needs are our priority.  As part of our continuing mission to provide you with exceptional heart care, we have created designated Provider Care Teams.  These Care Teams include your primary Cardiologist (physician) and Advanced Practice Providers (APPs -  Physician Assistants and Nurse Practitioners) who all work together to provide you with the care you need, when you need it. You will need a follow up appointment in 6-7 months.  Please call our office 2 months in advance to schedule this appointment.  You may see Gayatri A Acharya, MD or one of the following Advanced Practice Providers on your designated Care Team:   Rhonda Barrett, PA-C . Kathryn Lawrence, DNP, ANP  Any Other Special Instructions Will Be Listed Below (If Applicable). None   

## 2019-02-26 NOTE — Progress Notes (Signed)
Virtual Visit via Video Note   This visit type was conducted due to national recommendations for restrictions regarding the COVID-19 Pandemic (e.g. social distancing) in an effort to limit this patient's exposure and mitigate transmission in our community.  Due to her co-morbid illnesses, this patient is at least at moderate risk for complications without adequate follow up.  This format is felt to be most appropriate for this patient at this time.  All issues noted in this document were discussed and addressed.  A limited physical exam was performed with this format.  Please refer to the patient's chart for her consent to telehealth for [facility].   Date:  02/09/2019   ID:  Ann [name], DOB January 09, 1971, MRN [id number]  Patient Location: Home Provider Location: Home  PCP:  Maurice Small, MD  Cardiologist:  Elouise Munroe, MD  Electrophysiologist:  None   Evaluation Performed:  Follow-Up Visit  Chief Complaint:  F/u dizzy spells, bifascicular block.  History of Present Illness:    Ann [name] is a 48 y.o. female with a hx of anxiety, diabetes, multiple sclerosis, and hypertension. We have been following for dizzy spells in the setting of bifascicular block.  Overall doing well. No dizzy spells since last follow up. We reviewed monitor results again, and discussed that she has not yet demonstrated advanced conduction system disease, but she notes not having dizzy spells during the monitor. She also had significant intolerance to the adhesive on the monitor patch, and discontinued use after one week. We discussed attempting a different style of monitor if she has continued symptoms. However, fortunately she has had not had significant symptoms in the past several months.   She is tolerating losartan well for exercise hypertension.   The patient denies chest pain, chest pressure, dyspnea at rest or with exertion, palpitations, PND, orthopnea, or leg swelling. Denies syncope  or presyncope. Denies lightheadedness.   The patient does not have symptoms concerning for COVID-19 infection (fever, chills, cough, or new shortness of breath).    Past Medical History:  Diagnosis Date   Anxiety    Diabetes mellitus ([place]) 07/08/2018   Diabetes mellitus without complication (HCC)    MS (multiple sclerosis) ([place])    Past Surgical History:  Procedure Laterality Date   APPENDECTOMY     CESAREAN SECTION       Current Meds  Medication Sig   ALPRAZolam (XANAX) 0.25 MG tablet Take 0.25 mg by mouth 2 (two) times daily as needed.   losartan (COZAAR) 25 MG tablet Take 1 tablet (25 mg total) by mouth daily.   meclizine (ANTIVERT) 25 MG tablet Take 1 tablet (25 mg total) by mouth 3 (three) times daily as needed for dizziness.   metFORMIN (GLUCOPHAGE) 500 MG tablet Take 500 mg by mouth 2 (two) times daily with a meal.   norethindrone (JOLIVETTE) 0.35 MG tablet Take 1 tablet by mouth daily.   ONETOUCH DELICA LANCETS FINE MISC Use as directed   ONETOUCH VERIO test strip Use as directed     Allergies:   Amoxicillin; Hydrocodone-acetaminophen; and Vicodin [hydrocodone-acetaminophen]   Social History   Tobacco Use   Smoking status: Never Smoker   Smokeless tobacco: Never Used  Substance Use Topics   Alcohol use: No   Drug use: No     Family Hx: The patient's family history includes Heart attack in her mother; Sudden death in her mother.  ROS:   Please see the history of present illness.  All other systems reviewed and are negative.   Prior CV studies:   The following studies were reviewed today:    Labs/Other Tests and Data Reviewed:    EKG:  No ECG reviewed.  Recent Labs: 06/11/2018: Hemoglobin 15.3; Platelets 270 11/24/2018: BUN 10; Creatinine, Ser 0.90; Potassium 4.1; Sodium 138   Recent Lipid Panel No results found for: CHOL, TRIG, HDL, CHOLHDL, LDLCALC, LDLDIRECT  Wt Readings from Last 3 Encounters:  02/09/19 (!) 305 lb (138.3  kg)  11/11/18 (!) 310 lb (140.6 kg)  08/20/18 (!) 312 lb (141.5 kg)     Objective:    Vital Signs:  BP 117/77    Pulse 71    Ht 5\' 4"  (1.626 m)    Wt (!) 305 lb (138.3 kg)    BMI 52.35 kg/m    VITAL SIGNS:  reviewed GEN:  no acute distress EYES:  sclerae anicteric, EOMI - Extraocular Movements Intact RESPIRATORY:  normal respiratory effort, symmetric expansion CARDIOVASCULAR:  no peripheral edema SKIN:  no rash, lesions or ulcers. MUSCULOSKELETAL:  no obvious deformities. NEURO:  alert and oriented x 3, no obvious focal deficit PSYCH:  normal affect  ASSESSMENT & PLAN:    1. Dizziness   2. Essential hypertension   3. Bifascicular block   4. Multiple sclerosis ([place])   5. Type 2 diabetes mellitus without complication, without long-term current use of insulin (HCC)    Dizziness, improved - overall improving. I have encouraged her previously to follow up with neurology. We will continue to monitor for signs of worsening conduction system disease.   HTN with exercise - stable, tolerating losartan well.   Bifascicular block - avoid AV nodal blocking agents.  COVID-19 Education: The signs and symptoms of COVID-19 were discussed with the patient and how to seek care for testing (follow up with PCP or arrange E-visit).  The importance of social distancing was discussed today.  Time:   Today, I have spent 18 minutes with the patient with telehealth technology discussing the above problems.     Medication Adjustments/Labs and Tests Ordered: Current medicines are reviewed at length with the patient today.  Concerns regarding medicines are outlined above.   Tests Ordered: No orders of the defined types were placed in this encounter.   Medication Changes: No orders of the defined types were placed in this encounter.   Disposition:  Follow up in 6 month(s)  Signed, Elouise Munroe, MD  02/09/2019 11:21 AM    [place]

## 2019-02-28 DIAGNOSIS — E119 Type 2 diabetes mellitus without complications: Secondary | ICD-10-CM | POA: Diagnosis not present

## 2019-02-28 DIAGNOSIS — I10 Essential (primary) hypertension: Secondary | ICD-10-CM | POA: Diagnosis not present

## 2019-03-02 DIAGNOSIS — G35 Multiple sclerosis: Secondary | ICD-10-CM | POA: Diagnosis not present

## 2019-03-02 DIAGNOSIS — Z0000 Encounter for general adult medical examination without abnormal findings: Secondary | ICD-10-CM | POA: Diagnosis not present

## 2019-03-17 DIAGNOSIS — E119 Type 2 diabetes mellitus without complications: Secondary | ICD-10-CM | POA: Diagnosis not present

## 2019-03-17 DIAGNOSIS — H52203 Unspecified astigmatism, bilateral: Secondary | ICD-10-CM | POA: Diagnosis not present

## 2019-03-17 DIAGNOSIS — H5213 Myopia, bilateral: Secondary | ICD-10-CM | POA: Diagnosis not present

## 2019-04-18 DIAGNOSIS — J309 Allergic rhinitis, unspecified: Secondary | ICD-10-CM | POA: Diagnosis not present

## 2019-04-18 DIAGNOSIS — T781XXD Other adverse food reactions, not elsewhere classified, subsequent encounter: Secondary | ICD-10-CM | POA: Diagnosis not present

## 2019-04-18 DIAGNOSIS — J3089 Other allergic rhinitis: Secondary | ICD-10-CM | POA: Diagnosis not present

## 2019-04-21 DIAGNOSIS — Z1231 Encounter for screening mammogram for malignant neoplasm of breast: Secondary | ICD-10-CM | POA: Diagnosis not present

## 2019-04-21 DIAGNOSIS — Z01419 Encounter for gynecological examination (general) (routine) without abnormal findings: Secondary | ICD-10-CM | POA: Diagnosis not present

## 2019-04-21 DIAGNOSIS — Z6841 Body Mass Index (BMI) 40.0 and over, adult: Secondary | ICD-10-CM | POA: Diagnosis not present

## 2019-06-16 DIAGNOSIS — Z23 Encounter for immunization: Secondary | ICD-10-CM | POA: Diagnosis not present

## 2019-06-22 DIAGNOSIS — H532 Diplopia: Secondary | ICD-10-CM | POA: Diagnosis not present

## 2019-07-11 DIAGNOSIS — H532 Diplopia: Secondary | ICD-10-CM | POA: Diagnosis not present

## 2019-07-11 DIAGNOSIS — G35 Multiple sclerosis: Secondary | ICD-10-CM | POA: Diagnosis not present

## 2019-07-12 ENCOUNTER — Encounter: Payer: Self-pay | Admitting: Neurology

## 2019-08-19 ENCOUNTER — Other Ambulatory Visit: Payer: Self-pay

## 2019-08-19 ENCOUNTER — Ambulatory Visit (INDEPENDENT_AMBULATORY_CARE_PROVIDER_SITE_OTHER): Payer: BC Managed Care – PPO | Admitting: Neurology

## 2019-08-19 ENCOUNTER — Encounter: Payer: Self-pay | Admitting: Neurology

## 2019-08-19 VITALS — BP 134/88 | HR 94 | Ht 64.5 in | Wt 309.0 lb

## 2019-08-19 DIAGNOSIS — G35 Multiple sclerosis: Secondary | ICD-10-CM | POA: Insufficient documentation

## 2019-08-19 NOTE — Progress Notes (Signed)
[facility] Clinic Note - Initial Visit   Date: 08/19/19  Ann [name] MRN: [id number] DOB: October 06, 1970   Dear Dr. Justin Mend:  Thank you for your kind referral of Ann [name] for consultation of multiple sclerosis. Although her history is well known to you, please allow [place] to reiterate it for the purpose of our medical record. The patient was accompanied to the clinic by self.   History of Present Illness: Ann [name] is a 48 y.o. right-handed female with hypertension, diabetes mellitus (HbA1c 6), morbid obesity, PTSD, panic attacks, and allergic rhinitis presenting for evaluation of multiple sclerosis.   Her history of MS dates back to 2008 when she developed sudden diplopia and imaging done at [facility] which confirmed MS.  A year later, she developed clouding of vision and was treated with 3-days of IV steroids at home which improved symptoms.  She was not on DMT.    In May 2017, she developed dizziness and imbalance.  For these symptoms, she was referred to see my colleague, Dr. Tomi Likens,  who ordered imaging.  Her imaging showed findings consistent with demyelinating disease of the brain, with two acute lesions, and chronic plaque involving the cervical spine at C3-4. Diseaes-modifying therapies were discussed, but she did not follow-up again.   She has not had any imaging since 2017.    In late October 2020, she noticed that when she was driving, she felt that her vision was slower on the right side, but she did not have any double vision.  She did not notice this when looking to the left.  This was constant for two week and then self-resolved.  She denies any vision loss, eye pain, headache,numbness/tingling, or weakness.   She works as an Social worker.    Out-side paper records, electronic medical record, and images have been reviewed where available and summarized as:  MRI brain and cervical spine 07/08/2016: 1. Demyelinating  disease with moderately advanced cerebral white matter involvement. Two small areas of acute demyelination in the left corona radiata (series 30, image 89). 2. Small 4-5 mm chronic appearing demyelinating lesion in the cervical spinal cord at C3-C4. Sparing of the visible spinal cord otherwise. 3. No other intracranial abnormality. 4. Mild cervical spine degeneration overall, but with disc and endplate degeneration at C3-C4 resulting an mild to moderate bilateral C4 foraminal stenosis.   Past Medical History:  Diagnosis Date  . Anxiety   . Diabetes mellitus ([place]) 07/08/2018  . Diabetes mellitus without complication ([place])   . MS (multiple sclerosis) ([place])     Past Surgical History:  Procedure Laterality Date  . APPENDECTOMY    . CESAREAN SECTION       Medications:  Outpatient Encounter Medications as of 08/19/2019  Medication Sig  . ALPRAZolam (XANAX) 0.25 MG tablet Take 0.25 mg by mouth 2 (two) times daily as needed.  . fluticasone (FLONASE) 50 MCG/ACT nasal spray fluticasone propionate 50 mcg/actuation nasal spray,suspension  . meclizine (ANTIVERT) 25 MG tablet Take 1 tablet (25 mg total) by mouth 3 (three) times daily as needed for dizziness.  . metFORMIN (GLUCOPHAGE) 500 MG tablet Take 500 mg by mouth 2 (two) times daily with a meal.  . norethindrone (JOLIVETTE) 0.35 MG tablet Take 1 tablet by mouth daily.  Marland Kitchen ofloxacin (OCUFLOX) 0.3 % ophthalmic solution ofloxacin 0.3 % eye drops  . ONETOUCH DELICA LANCETS FINE MISC Use as directed  . ONETOUCH VERIO test strip Use as directed  . losartan (COZAAR) 25  MG tablet Take 1 tablet (25 mg total) by mouth daily.   Facility-Administered Encounter Medications as of 08/19/2019  Medication  . technetium tetrofosmin (TC-MYOVIEW) injection [id number] millicurie    Allergies:  Allergies  Allergen Reactions  . Amoxicillin Hives and Itching  . Hydrocodone-Acetaminophen Itching  . Vicodin [Hydrocodone-Acetaminophen] Itching    Family History:  Family History  Problem Relation Age of Onset  . Heart attack Mother   . Sudden death Mother     Social History: Social History   Tobacco Use  . Smoking status: Never Smoker  . Smokeless tobacco: Never Used  Substance Use Topics  . Alcohol use: No  . Drug use: No   Social History   Social History Narrative   Patient lives with mom in a one story home.  Has one daughter.  Works as an Social worker.  Education: BS   Right handed    Review of Systems:  CONSTITUTIONAL: No fevers, chills, night sweats, or weight loss.   EYES: No visual changes or eye pain ENT: No hearing changes.  No history of nose bleeds.   RESPIRATORY: No cough, wheezing and shortness of breath.   CARDIOVASCULAR: Negative for chest pain, and palpitations.   GI: Negative for abdominal discomfort, blood in stools or black stools.  No recent change in bowel habits.   GU:  No history of incontinence.   MUSCLOSKELETAL: No history of joint pain or swelling.  No myalgias.   SKIN: Negative for lesions, rash, and itching.   HEMATOLOGY/ONCOLOGY: Negative for prolonged bleeding, bruising easily, and swollen nodes.  No history of cancer.   ENDOCRINE: Negative for cold or heat intolerance, polydipsia or goiter.   PSYCH:  No depression or anxiety symptoms.   NEURO: As Above.   Vital Signs:  BP 134/88   Pulse 94   Ht 5' 4.5" (1.638 m)   Wt (!) 309 lb (140.2 kg)   SpO2 99%   BMI 52.22 kg/m    General Medical Exam:   General:  Well appearing, comfortable.   Eyes/ENT: see cranial nerve examination.   Neck:   No carotid bruits. Respiratory:  Clear to auscultation, good air entry bilaterally.   Cardiac:  Regular rate and rhythm, no murmur.   Extremities:  No deformities, edema, or skin discoloration.  Skin:  No rashes or lesions.  Neurological Exam: MENTAL STATUS including orientation to time, place, person, recent and remote memory, attention span and concentration, language, and fund of knowledge is  normal.  Speech is not dysarthric.  CRANIAL NERVES: II:  No visual field defects.  Unremarkable fundi.   III-IV-VI: Pupils equal round and reactive to light.  Normal conjugate, extra-ocular eye movements in all directions of gaze.  No nystagmus.  No ptosis.   V:  Normal facial sensation.    VII:  Normal facial symmetry and movements.   VIII:  Normal hearing and vestibular function.   IX-X:  Normal palatal movement.   XI:  Normal shoulder shrug and head rotation.   XII:  Normal tongue strength and range of motion, no deviation or fasciculation.  MOTOR:  No atrophy, fasciculations or abnormal movements.  No pronator drift.   Upper Extremity:  Right  Left  Deltoid  5/5   5/5   Biceps  5/5   5/5   Triceps  5/5   5/5   Infraspinatus 5/5  5/5  Medial pectoralis 5/5  5/5  Wrist extensors  5/5   5/5   Wrist flexors  5/5  5/5   Finger extensors  5/5   5/5   Finger flexors  5/5   5/5   Dorsal interossei  5/5   5/5   Abductor pollicis  5/5   5/5   Tone (Ashworth scale)  0  0   Lower Extremity:  Right  Left  Hip flexors  5/5   5/5   Hip extensors  5/5   5/5   Adductor 5/5  5/5  Abductor 5/5  5/5  Knee flexors  5/5   5/5   Knee extensors  5/5   5/5   Dorsiflexors  5/5   5/5   Plantarflexors  5/5   5/5   Toe extensors  5/5   5/5   Toe flexors  5/5   5/5   Tone (Ashworth scale)  0  0   MSRs: Reflexes are brisk and symmetric 2+/4 throughout.  Plantars are downgoing  SENSORY:  Normal and symmetric perception of light touch, pinprick, vibration, and proprioception.  Romberg's sign absent.   COORDINATION/GAIT: Normal finger-to- nose-finger and heel-to-shin.  Intact rapid alternating movements bilaterally.  Able to rise from a chair without using arms.  Gait narrow based and stable. Tandem and stressed gait intact.    IMPRESSION: Relapsing remitting multiple sclerosis, not on disease modifying therapy.  Symptom onset in 2008 with episode of double vision.  MRI brain and cervical spine  from 2017 was personally viewed and shows findings consistent with multiple sclerosis with disease burden involving the periventricular white matter, deep white matter, cerebellum (mild) and cervical cord. Recently, she had visual disturbance described as slow motion (bradyopsia) which self-resolved within 2 weeks.  I suspect she has progression of demyelinating disease and discussed at length the importance of disease modifying therapies to reduce risk of relapse and functional disability.  With her cord involvement, I suggest a more efficacious medication such as Tecfidera or Tysabri.  Risks and benefits of both medications were discussed, including rare cases of PML.  Information packet on both medications were provided and she will contact me office after reviewing this.   In the meantime, she is overdue for surveillance imaging.  MRI brain and cervical spine wwo contrast will be ordered.  Start vitamin D 4000U daily.  Further recommendations pending results.   Thank you for allowing me to participate in patient's care.  If I can answer any additional questions, I would be pleased to do so.    Sincerely,    Alba Kriesel K. Posey Pronto, DO

## 2019-08-19 NOTE — Patient Instructions (Addendum)
Please consider options of starting either Tecfidera (tablet) or Tysabri (infusion) for multiple sclerosis  Start vitamin D 4000 until daily  MRI brain and cervical spine wwo contrast  We will call you with the results  We have sent a referral to [facility] for your MRI and they will call you directly to schedule your appointment. They are located at [place]. If you need to contact them directly please call [phone].

## 2019-09-17 ENCOUNTER — Ambulatory Visit
Admission: RE | Admit: 2019-09-17 | Discharge: 2019-09-17 | Disposition: A | Discharge status: Home / Self Care | Payer: BC Managed Care – PPO | Source: Ambulatory Visit | Attending: Neurology | Admitting: Neurology

## 2019-09-17 ENCOUNTER — Other Ambulatory Visit: Payer: Self-pay

## 2019-09-17 DIAGNOSIS — G35 Multiple sclerosis: Secondary | ICD-10-CM

## 2019-09-17 MED ORDER — GADOBENATE DIMEGLUMINE 529 MG/ML IV SOLN
20.0000 mL | Freq: Once | INTRAVENOUS | Status: AC | PRN
  Administered 2019-09-17: 20 mL via INTRAVENOUS

## 2019-09-19 ENCOUNTER — Telehealth: Payer: Self-pay

## 2019-09-19 NOTE — Telephone Encounter (Signed)
Patient aware of results and appointment scheduled. 

## 2019-09-19 NOTE — Telephone Encounter (Signed)
-----   Message from Alda Berthold, DO sent at 09/19/2019  3:22 PM EST ----- Please inform patient that her MRI brain shows progression of multiple sclerosis as compared to prior study in 2017 and offer a VV/telephone to discuss medication management (Tysabri vs Tecfidera) which we briefly reviewed in the office (there is availability next Wed).  Please call, she does not appear active on MyChart. Thanks.

## 2019-09-27 ENCOUNTER — Encounter: Payer: Self-pay | Admitting: Neurology

## 2019-09-27 DIAGNOSIS — N920 Excessive and frequent menstruation with regular cycle: Secondary | ICD-10-CM | POA: Insufficient documentation

## 2019-09-28 ENCOUNTER — Telehealth (INDEPENDENT_AMBULATORY_CARE_PROVIDER_SITE_OTHER): Payer: BC Managed Care – PPO | Admitting: Neurology

## 2019-09-28 ENCOUNTER — Other Ambulatory Visit: Payer: Self-pay

## 2019-09-28 VITALS — Ht 64.0 in | Wt 309.0 lb

## 2019-09-28 DIAGNOSIS — G35 Multiple sclerosis: Secondary | ICD-10-CM

## 2019-09-28 NOTE — Progress Notes (Signed)
   Virtual Visit via Video Note The purpose of this virtual visit is to provide medical care while limiting exposure to the novel coronavirus.    Consent was obtained for video visit:  Yes.   Answered questions that patient had about telehealth interaction:  Yes.   I discussed the limitations, risks, security and privacy concerns of performing an evaluation and management service by telemedicine. I also discussed with the patient that there may be a patient responsible charge related to this service. The patient expressed understanding and agreed to proceed.  Pt location: Home Physician Location: office Name of referring provider:  Maurice Small, MD I connected with Ann [name] at patients initiation/request on 09/28/2019 at 10:30 AM EST by video enabled telemedicine application and verified that I am speaking with the correct person using two identifiers. Pt MRN:  [id number] Pt DOB:  Apr 04, 1971 Video Participants:  Ann [name];   History of Present Illness: This is a 48 y.o. female returning to discuss results of her MRI brain and cervical spine for relapsing remitting multiple sclerosis.  She had imaging on 09/17/2019 which shows 3 new active demyelinating lesion in the brain, nonenhancing midbrain lesion, and no new cervical cord lesions.  At her last visit, I discussed the role of disease-modifying therapies, specifically to consider Tecfidera or Tysabri, given efficacy and tolerability.  She has taken the advice of her uncle who's wife passed from complicated related to MS and was on Copaxone.  She is asking my opinion on whether Copaxone would be an option for her. She remains undecided on which medication to consider and has many questions.    Observations/Objective:   Vitals:   09/27/19 1523  Weight: (!) 309 lb (140.2 kg)  Height: 5\' 4"  (1.626 m)   Patient is awake, alert, and appears comfortable.  Oriented x 4.   Extraocular muscles are intact..  Face is symmetric.   Speech is not dysarthric.   DATA: MRI brain and cervical spine 09/17/2019: 1. Multiple sclerosis with 3 new subcentimeter enhancing supratentorial lesions consistent with acute demyelination. 2. New nonenhancing midbrain lesion. 3. Unchanged cervical spinal cord involvement at C3-4. No new cord lesions.  Assessment and Plan:  Relapsing-remitting multiple sclerosis (diagnosed 2006) no on disease modifying therapy. I have personally viewed her MRI brain and cervical spine from 09/17/2019 which shows moderate disease burden involving the brain, with new chronic midbrain lesion and three active demyelinating lesions.  Cervical spine imaging shows unchanged lesion at C3-4, with no new cervical cord lesions.  Again, I discussed the role of DMTs and various options.  Given that there are more efficacious options available, I do not recommend Copaxone, especially as she as active disease with known cord involvement.  I had long discussion on the side effects, benefits, and relapse rate reduction with Tecfidera and Tysabri.  She will give this more thought and contact my office when she has made a decision.  She will need baseline labs for either medication.   Follow Up Instructions:   I discussed the assessment and treatment plan with the patient. The patient was provided an opportunity to ask questions and all were answered. The patient agreed with the plan and demonstrated an understanding of the instructions.   The patient was advised to call back or seek an in-person evaluation if the symptoms worsen or if the condition fails to improve as anticipated  Total time spent:  25 minutes     [place], DO

## 2019-10-08 DIAGNOSIS — R1030 Lower abdominal pain, unspecified: Secondary | ICD-10-CM | POA: Diagnosis not present

## 2019-10-08 DIAGNOSIS — R300 Dysuria: Secondary | ICD-10-CM | POA: Diagnosis not present

## 2019-10-10 DIAGNOSIS — R399 Unspecified symptoms and signs involving the genitourinary system: Secondary | ICD-10-CM | POA: Diagnosis not present

## 2019-10-28 ENCOUNTER — Other Ambulatory Visit: Payer: Self-pay | Admitting: Internal Medicine

## 2019-11-29 DIAGNOSIS — H9202 Otalgia, left ear: Secondary | ICD-10-CM | POA: Diagnosis not present

## 2019-11-29 DIAGNOSIS — J019 Acute sinusitis, unspecified: Secondary | ICD-10-CM | POA: Diagnosis not present

## 2019-11-29 DIAGNOSIS — J3489 Other specified disorders of nose and nasal sinuses: Secondary | ICD-10-CM | POA: Diagnosis not present

## 2019-11-29 DIAGNOSIS — Z20822 Contact with and (suspected) exposure to covid-19: Secondary | ICD-10-CM | POA: Diagnosis not present

## 2019-12-23 ENCOUNTER — Encounter (HOSPITAL_COMMUNITY): Payer: Self-pay | Admitting: Emergency Medicine

## 2019-12-23 ENCOUNTER — Emergency Department (HOSPITAL_COMMUNITY)
Admission: EM | Admit: 2019-12-23 | Discharge: 2019-12-24 | Disposition: A | Discharge status: Home / Self Care | Payer: BC Managed Care – PPO | Attending: Emergency Medicine | Admitting: Emergency Medicine

## 2019-12-23 DIAGNOSIS — D259 Leiomyoma of uterus, unspecified: Secondary | ICD-10-CM | POA: Diagnosis not present

## 2019-12-23 DIAGNOSIS — R102 Pelvic and perineal pain: Secondary | ICD-10-CM

## 2019-12-23 DIAGNOSIS — R109 Unspecified abdominal pain: Secondary | ICD-10-CM | POA: Diagnosis not present

## 2019-12-23 DIAGNOSIS — Z7984 Long term (current) use of oral hypoglycemic drugs: Secondary | ICD-10-CM | POA: Diagnosis not present

## 2019-12-23 DIAGNOSIS — E119 Type 2 diabetes mellitus without complications: Secondary | ICD-10-CM | POA: Insufficient documentation

## 2019-12-23 DIAGNOSIS — G35 Multiple sclerosis: Secondary | ICD-10-CM | POA: Insufficient documentation

## 2019-12-23 DIAGNOSIS — Z79899 Other long term (current) drug therapy: Secondary | ICD-10-CM | POA: Diagnosis not present

## 2019-12-23 DIAGNOSIS — N898 Other specified noninflammatory disorders of vagina: Secondary | ICD-10-CM | POA: Diagnosis not present

## 2019-12-23 DIAGNOSIS — K5732 Diverticulitis of large intestine without perforation or abscess without bleeding: Secondary | ICD-10-CM | POA: Diagnosis not present

## 2019-12-23 DIAGNOSIS — R9431 Abnormal electrocardiogram [ECG] [EKG]: Secondary | ICD-10-CM | POA: Diagnosis not present

## 2019-12-23 LAB — COMPREHENSIVE METABOLIC PANEL
ALT: 14 U/L (ref 0–44)
AST: 16 U/L (ref 15–41)
Albumin: 3.8 g/dL (ref 3.5–5.0)
Alkaline Phosphatase: 57 U/L (ref 38–126)
Anion gap: 11 (ref 5–15)
BUN: 8 mg/dL (ref 6–20)
CO2: 28 mmol/L (ref 22–32)
Calcium: 9 mg/dL (ref 8.9–10.3)
Chloride: 102 mmol/L (ref 98–111)
Creatinine, Ser: 1.18 mg/dL — ABNORMAL HIGH (ref 0.44–1.00)
GFR calc Af Amer: >60 mL/min (ref >60)
GFR calc non Af Amer: 54 mL/min — ABNORMAL LOW (ref >60)
Glucose, Bld: 151 mg/dL — ABNORMAL HIGH (ref 70–99)
Potassium: 4.1 mmol/L (ref 3.5–5.1)
Sodium: 141 mmol/L (ref 135–145)
Total Bilirubin: 1.2 mg/dL (ref 0.3–1.2)
Total Protein: 7.6 g/dL (ref 6.5–8.1)

## 2019-12-23 LAB — URINALYSIS, ROUTINE W REFLEX MICROSCOPIC
Bilirubin Urine: NEGATIVE
Glucose, UA: NEGATIVE mg/dL
Hgb urine dipstick: NEGATIVE
Ketones, ur: NEGATIVE mg/dL
Leukocytes,Ua: NEGATIVE
Nitrite: NEGATIVE
Protein, ur: NEGATIVE mg/dL
Specific Gravity, Urine: 1.012 (ref 1.005–1.030)
pH: 5 (ref 5.0–8.0)

## 2019-12-23 LAB — CBC
HCT: 46 % (ref 36.0–46.0)
Hemoglobin: 14.9 g/dL (ref 12.0–15.0)
MCH: 27.4 pg (ref 26.0–34.0)
MCHC: 32.4 g/dL (ref 30.0–36.0)
MCV: 84.7 fL (ref 80.0–100.0)
Platelets: 285 10*3/uL (ref 150–400)
RBC: 5.43 MIL/uL — ABNORMAL HIGH (ref 3.87–5.11)
RDW: 13.6 % (ref 11.5–15.5)
WBC: 11.7 10*3/uL — ABNORMAL HIGH (ref 4.0–10.5)
nRBC: 0 % (ref 0.0–0.2)

## 2019-12-23 LAB — LIPASE, BLOOD: Lipase: 23 U/L (ref 11–51)

## 2019-12-23 MED ORDER — SODIUM CHLORIDE 0.9% FLUSH: 3.0000 mL | Freq: Once | INTRAVENOUS | Status: DC

## 2019-12-23 NOTE — ED Triage Notes (Signed)
Pt c/o lower abd pain onset yesterday, +nausea. Denies UTI s/s, denies gyn s/s.

## 2019-12-24 ENCOUNTER — Emergency Department (HOSPITAL_COMMUNITY): Payer: BC Managed Care – PPO

## 2019-12-24 ENCOUNTER — Other Ambulatory Visit: Payer: Self-pay

## 2019-12-24 DIAGNOSIS — K8020 Calculus of gallbladder without cholecystitis without obstruction: Secondary | ICD-10-CM | POA: Diagnosis not present

## 2019-12-24 DIAGNOSIS — N858 Other specified noninflammatory disorders of uterus: Secondary | ICD-10-CM | POA: Diagnosis not present

## 2019-12-24 DIAGNOSIS — K429 Umbilical hernia without obstruction or gangrene: Secondary | ICD-10-CM | POA: Diagnosis not present

## 2019-12-24 LAB — WET PREP, GENITAL
Clue Cells Wet Prep HPF POC: NONE SEEN
Sperm: NONE SEEN
Trich, Wet Prep: NONE SEEN
Yeast Wet Prep HPF POC: NONE SEEN

## 2019-12-24 LAB — HCG, QUANTITATIVE, PREGNANCY: hCG, Beta Chain, Quant, S: <1 m[IU]/mL (ref <5)

## 2019-12-24 MED ORDER — IOHEXOL 300 MG/ML  SOLN
125.0000 mL | Freq: Once | INTRAMUSCULAR | Status: AC | PRN
  Administered 2019-12-24: 04:00:00 125 mL via INTRAVENOUS

## 2019-12-24 MED ORDER — SODIUM CHLORIDE 0.9 % IV BOLUS
1000.0000 mL | Freq: Once | INTRAVENOUS | Status: AC
  Administered 2019-12-24: 04:00:00 1000 mL via INTRAVENOUS

## 2019-12-24 MED ORDER — ONDANSETRON HCL 4 MG/2ML IJ SOLN
4.0000 mg | Freq: Once | INTRAMUSCULAR | Status: AC
  Administered 2019-12-24: 03:00:00 4 mg via INTRAVENOUS
  Filled 2019-12-24: qty 2

## 2019-12-24 MED ORDER — TRAMADOL HCL 50 MG PO TABS: 50.0000 mg | ORAL_TABLET | Freq: Four times a day (QID) | ORAL | 0 refills | Status: DC | PRN

## 2019-12-24 MED ORDER — MORPHINE SULFATE (PF) 4 MG/ML IV SOLN
4.0000 mg | Freq: Once | INTRAVENOUS | Status: AC
  Administered 2019-12-24: 06:00:00 4 mg via INTRAVENOUS
  Filled 2019-12-24: qty 1

## 2019-12-24 MED ORDER — CIPROFLOXACIN HCL 500 MG PO TABS
500.0000 mg | ORAL_TABLET | Freq: Once | ORAL | Status: AC
  Administered 2019-12-24: 500 mg via ORAL
  Filled 2019-12-24: qty 1

## 2019-12-24 MED ORDER — METRONIDAZOLE 500 MG PO TABS: 500.0000 mg | ORAL_TABLET | Freq: Three times a day (TID) | ORAL | 0 refills | Status: DC

## 2019-12-24 MED ORDER — MORPHINE SULFATE (PF) 4 MG/ML IV SOLN
4.0000 mg | Freq: Once | INTRAVENOUS | Status: AC
  Administered 2019-12-24: 03:00:00 4 mg via INTRAVENOUS
  Filled 2019-12-24: qty 1

## 2019-12-24 MED ORDER — CIPROFLOXACIN HCL 500 MG PO TABS: 500.0000 mg | ORAL_TABLET | Freq: Two times a day (BID) | ORAL | 0 refills | Status: DC

## 2019-12-24 MED ORDER — METRONIDAZOLE 500 MG PO TABS
500.0000 mg | ORAL_TABLET | Freq: Once | ORAL | Status: AC
  Administered 2019-12-24: 08:00:00 500 mg via ORAL
  Filled 2019-12-24: qty 1

## 2019-12-24 MED ORDER — ONDANSETRON 4 MG PO TBDP: 4.0000 mg | ORAL_TABLET | Freq: Three times a day (TID) | ORAL | 0 refills | Status: DC | PRN

## 2019-12-24 NOTE — ED Notes (Signed)
Pt to US.

## 2019-12-24 NOTE — Discharge Instructions (Signed)
Take the antibiotics (Cipro and Flagyl) as directed until gone. Take zofran for nausea and Percocet for pain as needed.   Follow up with your doctor next week to insure your symptoms resolve. Return to the ED with any worsening symptoms or new concern.

## 2019-12-24 NOTE — ED Notes (Signed)
Pt to ct 

## 2019-12-24 NOTE — ED Notes (Signed)
Patient verbalizes understanding of discharge instructions . Opportunity for questions and answers were provided . Armband removed by staff ,Pt discharged from ED. W/C  offered at D/C  and Declined W/C at D/C and was escorted to lobby by RN.  

## 2019-12-24 NOTE — ED Provider Notes (Signed)
[facility] EMERGENCY DEPARTMENT Provider Note   CSN: [id number] Arrival date & time: 12/23/19  2021     History Chief Complaint  Patient presents with  . Abdominal Pain    Ann [name] is a 49 y.o. female.  Patient to the ED with lower abdominal pain that started yesterday. She reports a history of fibroids but no significant recurrent pain. No vaginal bleeding or vaginal discharge. No fever. She has nausea without vomiting. No diarrhea. Her last bowel movement was yesterday. She continues to pass flatus. She was seen at Urgent Care prior to arrival and was sent here for further evaluation. No chest pain, SOB, fever, congestion.   The history is provided by the patient. No language interpreter was used.  Abdominal Pain Associated symptoms: nausea   Associated symptoms: no chest pain, no chills, no dysuria, no fever, no shortness of breath, no vaginal bleeding and no vomiting        Past Medical History:  Diagnosis Date  . Anxiety   . Diabetes mellitus ([place]) 07/08/2018  . Diabetes mellitus without complication ([place])   . MS (multiple sclerosis) ([place])     Patient Active Problem List   Diagnosis Date Noted  . Menorrhagia 09/27/2019  . Relapsing remitting multiple sclerosis ([place]) 08/19/2019  . Diabetes mellitus ([place]) 07/08/2018    Past Surgical History:  Procedure Laterality Date  . APPENDECTOMY    . CESAREAN SECTION       OB History   No obstetric history on file.     Family History  Problem Relation Age of Onset  . Heart attack Mother   . Sudden death Mother     Social History   Tobacco Use  . Smoking status: Never Smoker  . Smokeless tobacco: Never Used  Substance Use Topics  . Alcohol use: No  . Drug use: No    Home Medications Prior to Admission medications   Medication Sig Start Date End Date Taking? Authorizing Provider  ALPRAZolam (XANAX) 0.25 MG tablet Take 0.25 mg by mouth 2 (two) times daily as needed. 06/15/18    [provider]  fluticasone (FLONASE) 50 MCG/ACT nasal spray fluticasone propionate 50 mcg/actuation nasal spray,suspension    [provider]  losartan (COZAAR) 25 MG tablet Take 1 tablet (25 mg total) by mouth daily. 11/11/18   Elouise Munroe, MD  metFORMIN (GLUCOPHAGE) 500 MG tablet Take 500 mg by mouth 2 (two) times daily with a meal.    [provider]  norethindrone (JOLIVETTE) 0.35 MG tablet Take 1 tablet by mouth daily.    [provider]  Jonetta Speak LANCETS FINE MISC Use as directed 02/15/16   [provider]  Roma Schanz test strip Use as directed 02/15/16   [provider]    Allergies    Amoxicillin, Hydrocodone-acetaminophen, and Vicodin [hydrocodone-acetaminophen]  Review of Systems   Review of Systems  Constitutional: Negative for chills and fever.  Respiratory: Negative.  Negative for shortness of breath.   Cardiovascular: Negative.  Negative for chest pain.  Gastrointestinal: Positive for abdominal pain and nausea. Negative for vomiting.  Genitourinary: Negative for dysuria, frequency and vaginal bleeding.  Musculoskeletal: Negative.  Negative for myalgias.  Skin: Negative.   Neurological: Negative.  Negative for weakness and light-headedness.    Physical Exam Updated Vital Signs BP 125/83 (BP Location: Right Arm)   Pulse 94   Temp 98.3 F (36.8 C)   Resp 14   SpO2 99%   Physical Exam Vitals  and nursing note reviewed.  Constitutional:      Appearance: She is well-developed.  HENT:     Head: Normocephalic.  Cardiovascular:     Rate and Rhythm: Normal rate and regular rhythm.  Pulmonary:     Effort: Pulmonary effort is normal.     Breath sounds: Normal breath sounds.  Abdominal:     General: Bowel sounds are normal.     Palpations: Abdomen is soft.     Tenderness: There is generalized abdominal tenderness (Suprapubic tenderness with ). There is guarding. There is no rebound.   Genitourinary:    Vagina: Vaginal discharge (White, thick) present.     Cervix: Cervical motion tenderness present.     Adnexa:        Right: No tenderness.         Left: No tenderness.       Comments: Exam limited by body habitus, presence of sigmoid diverticulitis Musculoskeletal:        General: Normal range of motion.     Cervical back: Normal range of motion and neck supple.  Skin:    General: Skin is warm and dry.     Findings: No rash.  Neurological:     Mental Status: She is alert.     Cranial Nerves: No cranial nerve deficit.     ED Results / Procedures / Treatments   Labs (all labs ordered are listed, but only abnormal results are displayed) Labs Reviewed  COMPREHENSIVE METABOLIC PANEL - Abnormal; Notable for the following components:      Result Value   Glucose, Bld 151 (*)    Creatinine, Ser 1.18 (*)    GFR calc non Af Amer 54 (*)    All other components within normal limits  CBC - Abnormal; Notable for the following components:   WBC 11.7 (*)    RBC 5.43 (*)    All other components within normal limits  LIPASE, BLOOD  URINALYSIS, ROUTINE W REFLEX MICROSCOPIC    EKG EKG Interpretation  Date/Time:  Saturday December 24 2019 02:42:46 EDT Ventricular Rate:  92 PR Interval:    QRS Duration: 143 QT Interval:  410 QTC Calculation: 508 R Axis:   -77 Text Interpretation: Sinus rhythm Probable left atrial enlargement RBBB and LAFB No significant change since last tracing Confirmed by Pryor Curia [phone]) on 12/24/2019 2:46:10 AM   Radiology No results found.  Procedures Procedures (including critical care time)  Medications Ordered in ED Medications  sodium chloride flush (NS) 0.9 % injection 3 mL (has no administration in time range)  morphine 4 MG/ML injection 4 mg (has no administration in time range)  ondansetron (ZOFRAN) injection 4 mg (has no administration in time range)    ED Course  I have reviewed the triage vital signs and the nursing  notes.  Pertinent labs & imaging results that were available during my care of the patient were reviewed by me and considered in my medical decision making (see chart for details).    MDM Rules/Calculators/A&P                      Patient to ED with lower abdominal pain primarily, with referred abdominal tenderness to suprapubic abdomen x 1 day. No fever. Nausea without vomiting.   The patient is nontoxic in appearance. She appears uncomfortable. Notes from Urgent Care reviewed. She was felt to have an acute abdomen on exam.   Labs are stable without significant abnormality. Mild leukocytosis 11.7, mild elevated  of creatinine to 1.18. Bolus fluids provided. She feels better with pain medications. CT scan ordered for further evaluation.   CT scan shows uncomplicated sigmoid diverticulitis. It also shows a 2.8 cm tubular structure mid-pelvis - complex cyst vs small hydrosalpinx. Pelvic US ordered for further evaluation.  [place] results show the previously seen mass is c/w known uterine fibroid. No evidence of infection, torsion or abscess.   The patient is felt appropriate for discharge home with Cipro/Flagyl for uncomplicated diverticulitis. Recommend follow up with her PCP for recheck next week to insure improvement. Return precautions discussed that should prompt return to the ED.       Final Clinical Impression(s) / ED Diagnoses Final diagnoses:  None   1. Uncomplicated sigmoid diverticulitis  Rx / DC Orders ED Discharge Orders    None       Charlann Lange, PA-C 12/24/19 0725    Ward, Delice Bison, DO 12/24/19 2308

## 2019-12-27 LAB — GC/CHLAMYDIA PROBE AMP (~~LOC~~) NOT AT ARMC
Chlamydia: NEGATIVE
Neisseria Gonorrhea: NEGATIVE

## 2019-12-28 DIAGNOSIS — K5792 Diverticulitis of intestine, part unspecified, without perforation or abscess without bleeding: Secondary | ICD-10-CM | POA: Diagnosis not present

## 2020-02-09 ENCOUNTER — Other Ambulatory Visit: Payer: Self-pay

## 2020-02-09 MED ORDER — LOSARTAN POTASSIUM 25 MG PO TABS: 25.0000 mg | ORAL_TABLET | Freq: Every day | ORAL | 1 refills | Status: DC

## 2020-02-17 DIAGNOSIS — Z6841 Body Mass Index (BMI) 40.0 and over, adult: Secondary | ICD-10-CM | POA: Diagnosis not present

## 2020-02-17 DIAGNOSIS — N950 Postmenopausal bleeding: Secondary | ICD-10-CM | POA: Diagnosis not present

## 2020-03-20 DIAGNOSIS — E119 Type 2 diabetes mellitus without complications: Secondary | ICD-10-CM | POA: Diagnosis not present

## 2020-03-20 DIAGNOSIS — H5213 Myopia, bilateral: Secondary | ICD-10-CM | POA: Diagnosis not present

## 2020-05-06 ENCOUNTER — Other Ambulatory Visit: Payer: Self-pay | Admitting: Internal Medicine

## 2020-05-10 DIAGNOSIS — Z6841 Body Mass Index (BMI) 40.0 and over, adult: Secondary | ICD-10-CM | POA: Diagnosis not present

## 2020-05-10 DIAGNOSIS — Z124 Encounter for screening for malignant neoplasm of cervix: Secondary | ICD-10-CM | POA: Diagnosis not present

## 2020-05-10 DIAGNOSIS — Z01419 Encounter for gynecological examination (general) (routine) without abnormal findings: Secondary | ICD-10-CM | POA: Diagnosis not present

## 2020-05-10 DIAGNOSIS — Z1231 Encounter for screening mammogram for malignant neoplasm of breast: Secondary | ICD-10-CM | POA: Diagnosis not present

## 2020-06-09 DIAGNOSIS — H6993 Unspecified Eustachian tube disorder, bilateral: Secondary | ICD-10-CM | POA: Diagnosis not present

## 2020-08-06 DIAGNOSIS — Z01818 Encounter for other preprocedural examination: Secondary | ICD-10-CM | POA: Diagnosis not present

## 2020-08-30 ENCOUNTER — Other Ambulatory Visit: Payer: Self-pay | Admitting: Internal Medicine

## 2020-09-03 ENCOUNTER — Other Ambulatory Visit: Payer: Self-pay | Admitting: Internal Medicine

## 2020-09-03 NOTE — Telephone Encounter (Signed)
    *  STAT* If patient is at the pharmacy, call can be transferred to refill team.   1. Which medications need to be refilled? (please list name of each medication and dose if known) losartan (COZAAR) 25 MG tablet  2. Which pharmacy/location (including street and city if local pharmacy) is medication to be sent to? [facility], [place] - [address] AT [place]  3. Do they need a 30 day or 90 day supply? 90 days   Pt ran out of meds

## 2020-09-03 NOTE — Telephone Encounter (Signed)
*  STAT* If patient is at the pharmacy, call can be transferred to refill team.   1. Which medications need to be refilled? (please list name of each medication and dose if known)  Losartan  2. Which pharmacy/location (including street and city if local pharmacy) is medication to be sent to? [facility], [place]  3. Do they need a 30 day or 90 day supply? 90 days and refills

## 2020-09-04 ENCOUNTER — Other Ambulatory Visit: Payer: Self-pay | Admitting: Internal Medicine

## 2020-09-04 NOTE — Telephone Encounter (Signed)
   *  STAT* If patient is at the pharmacy, call can be transferred to refill team.   1. Which medications need to be refilled? (please list name of each medication and dose if known)   losartan (COZAAR) 25 MG tablet    2. Which pharmacy/location (including street and city if local pharmacy) is medication to be sent to? [facility], [place] - [address] AT [place]  3. Do they need a 30 day or 90 day supply? 90 days   Pt is completely out of medications   Notes to Pharmacy: ZERO refills remain on this prescription. Your patient is requesting advance approval of refills for this medication to [place]

## 2020-09-05 NOTE — Telephone Encounter (Signed)
Spoke to patient . Informed her medication is available at pharmacy to pick up .  30 day supply with 2 refills. Patient last visit was in 5/ 2020.  patient is aware she will need an appointment to continue to have medication prescribed.  patient agreed to an appointment for 10/09/20 at 9:40 am with Dr Margaretann Loveless.

## 2020-09-05 NOTE — Telephone Encounter (Signed)
*  STAT* If patient is at the pharmacy, call can be transferred to refill team.   1. Which medications need to be refilled? (please list name of each medication and dose if known) losartan (COZAAR) 25 MG tablet  2. Which pharmacy/location (including street and city if local pharmacy) is medication to be sent to? [facility], [place] - [address] AT [place]  3. Do they need a 30 day or 90 day supply? 90   Patient states that this is her 4th time calling and now she is out of medication. Please advise her when refill is sent.

## 2020-10-02 ENCOUNTER — Other Ambulatory Visit: Payer: Self-pay | Admitting: Internal Medicine

## 2020-10-02 DIAGNOSIS — Z1211 Encounter for screening for malignant neoplasm of colon: Secondary | ICD-10-CM | POA: Diagnosis not present

## 2020-10-02 DIAGNOSIS — D125 Benign neoplasm of sigmoid colon: Secondary | ICD-10-CM | POA: Diagnosis not present

## 2020-10-02 DIAGNOSIS — D120 Benign neoplasm of cecum: Secondary | ICD-10-CM | POA: Diagnosis not present

## 2020-10-02 DIAGNOSIS — K5730 Diverticulosis of large intestine without perforation or abscess without bleeding: Secondary | ICD-10-CM | POA: Diagnosis not present

## 2020-10-02 DIAGNOSIS — D123 Benign neoplasm of transverse colon: Secondary | ICD-10-CM | POA: Diagnosis not present

## 2020-10-09 ENCOUNTER — Ambulatory Visit: Payer: BC Managed Care – PPO | Admitting: Internal Medicine

## 2020-10-12 DIAGNOSIS — R509 Fever, unspecified: Secondary | ICD-10-CM | POA: Diagnosis not present

## 2020-10-12 DIAGNOSIS — J019 Acute sinusitis, unspecified: Secondary | ICD-10-CM | POA: Diagnosis not present

## 2020-10-17 ENCOUNTER — Ambulatory Visit: Payer: BC Managed Care – PPO | Admitting: Internal Medicine

## 2020-11-05 ENCOUNTER — Ambulatory Visit: Payer: BC Managed Care – PPO | Admitting: Internal Medicine

## 2020-11-05 ENCOUNTER — Other Ambulatory Visit: Payer: Self-pay | Admitting: Internal Medicine

## 2020-11-05 ENCOUNTER — Encounter: Payer: Self-pay | Admitting: Internal Medicine

## 2020-11-05 ENCOUNTER — Other Ambulatory Visit: Payer: Self-pay

## 2020-11-05 VITALS — BP 120/70 | HR 92 | Ht 64.0 in | Wt 283.0 lb

## 2020-11-05 DIAGNOSIS — E119 Type 2 diabetes mellitus without complications: Secondary | ICD-10-CM

## 2020-11-05 DIAGNOSIS — R42 Dizziness and giddiness: Secondary | ICD-10-CM | POA: Diagnosis not present

## 2020-11-05 DIAGNOSIS — I452 Bifascicular block: Secondary | ICD-10-CM

## 2020-11-05 DIAGNOSIS — G35 Multiple sclerosis: Secondary | ICD-10-CM

## 2020-11-05 DIAGNOSIS — I10 Essential (primary) hypertension: Secondary | ICD-10-CM | POA: Diagnosis not present

## 2020-11-05 MED ORDER — LOSARTAN POTASSIUM 25 MG PO TABS: 25.0000 mg | ORAL_TABLET | Freq: Every day | ORAL | 3 refills | Status: DC

## 2020-11-05 NOTE — Progress Notes (Signed)
Cardiology Office Note:    Date:  11/05/2020   ID:  Ann PROSPERO, DOB 16-Feb-1971, MRN [id number]  PCP:  Maurice Small, MD  Cardiologist:  Elouise Munroe, MD  Electrophysiologist:  None   Referring MD: Maurice Small, MD   Chief Complaint/Reason for Referral: HTN, bifascicular block  History of Present Illness:    Ann [name] is a 50 y.o. female with a history of anxiety, diabetes, multiple sclerosis, and hypertension. We have been following for dizzy spells in the setting of bifascicular block. Last seen on Feb 27, 2019.   She has an 21 yo daughter. Daughter is doing well.   She tells me her dizziness for which we initially met is now gone. She is hydrating better, and started vitamin D supplementation.   Losartan prescribed for exercise related HTN. Tolerating well and here for follow up to continue refills.   The patient denies chest pain, chest pressure, dyspnea at rest or with exertion, palpitations, PND, orthopnea, or leg swelling. Denies cough, fever, chills. Denies nausea, vomiting. Denies syncope or presyncope. Denies dizziness or lightheadedness.   Past Medical History:  Diagnosis Date  . Anxiety   . Diabetes mellitus ([place]) 07/08/2018  . Diabetes mellitus without complication ([place])   . MS (multiple sclerosis) ([place])     Past Surgical History:  Procedure Laterality Date  . APPENDECTOMY    . CESAREAN SECTION      Current Medications: Current Meds  Medication Sig  . ALPRAZolam (XANAX) 0.25 MG tablet Take 0.25 mg by mouth 2 (two) times daily as needed.  . fluticasone (FLONASE) 50 MCG/ACT nasal spray fluticasone propionate 50 mcg/actuation nasal spray,suspension  . losartan (COZAAR) 25 MG tablet TAKE 1 TABLET(25 MG) BY MOUTH DAILY  . metFORMIN (GLUCOPHAGE) 500 MG tablet Take 500 mg by mouth 2 (two) times daily with a meal.  . norethindrone (MICRONOR) 0.35 MG tablet Take 1 tablet by mouth daily.  . ondansetron (ZOFRAN ODT) 4 MG disintegrating tablet Take 1  tablet (4 mg total) by mouth every 8 (eight) hours as needed for nausea or vomiting.  [name] DELICA LANCETS FINE MISC Use as directed  . ONETOUCH VERIO test strip Use as directed     Allergies:   Amoxicillin, Hydrocodone-acetaminophen, and Vicodin [hydrocodone-acetaminophen]   Social History   Tobacco Use  . Smoking status: Never Smoker  . Smokeless tobacco: Never Used  Vaping Use  . Vaping Use: Never used  Substance Use Topics  . Alcohol use: No  . Drug use: No     Family History: The patient's family history includes Heart attack in her mother; Sudden death in her mother.  ROS:   Please see the history of present illness.    All other systems reviewed and are negative.  EKGs/Labs/Other Studies Reviewed:    The following studies were reviewed today:  EKG:  NSR, LAE, RBBB, LAFB, QRS duration 130 msec.   Recent Labs: 12/23/2019: ALT 14; BUN 8; Creatinine, Ser 1.18; Hemoglobin 14.9; Platelets 285; Potassium 4.1; Sodium 141  Recent Lipid Panel No results found for: CHOL, TRIG, HDL, CHOLHDL, VLDL, LDLCALC, LDLDIRECT  Physical Exam:    VS:  BP 120/70 (BP Location: Left Arm, Patient Position: Sitting, Cuff Size: Normal)   Pulse 92   Ht 5' 4" (1.626 m)   Wt 283 lb (128.4 kg)   SpO2 98%   BMI 48.58 kg/m     Wt Readings from Last 5 Encounters:  11/05/20 283 lb (128.4 kg)  12/24/19 299 lb (  135.6 kg)  09/27/19 (!) 309 lb (140.2 kg)  08/19/19 (!) 309 lb (140.2 kg)  02/09/19 (!) 305 lb (138.3 kg)    Constitutional: No acute distress Eyes: sclera non-icteric, normal conjunctiva and lids ENMT: normal dentition, moist mucous membranes Cardiovascular: regular rhythm, normal rate, no murmurs. S1 and S2 normal. Radial pulses normal bilaterally. No jugular venous distention.  Respiratory: clear to auscultation bilaterally GI : normal bowel sounds, soft and nontender. No distention.   MSK: extremities warm, well perfused. No edema.  NEURO: grossly nonfocal exam, moves all  extremities. PSYCH: alert and oriented x 3, normal mood and affect.   ASSESSMENT:    1. Essential hypertension   2. Bifascicular block   3. Dizziness   4. Multiple sclerosis ([place])   5. Type 2 diabetes mellitus without complication, without long-term current use of insulin (HCC)    PLAN:    Essential hypertension - Plan: EKG 12-Lead -continue losartan 25 mg daily. HTN noted with activity.   Bifascicular block - avoiding AV nodal blocking agents. No syncope. Stable overall.   Dizziness - resolved, continue to monitor symptoms.   Type 2 diabetes mellitus without complication, without long-term current use of insulin (HCC) - on metformin.   Total time of encounter: 30 minutes total time of encounter, including 21 minutes spent in face-to-face patient care on the date of this encounter. This time includes coordination of care and counseling regarding above mentioned problem list. Remainder of non-face-to-face time involved reviewing chart documents/testing relevant to the patient encounter and documentation in the medical record. I have independently reviewed documentation from referring provider.   Cherlynn Kaiser, MD [facility]  [facility]    Medication Adjustments/Labs and Tests Ordered: Current medicines are reviewed at length with the patient today.  Concerns regarding medicines are outlined above.   Orders Placed This Encounter  Procedures  . EKG 12-Lead    Meds ordered this encounter  Medications  . losartan (COZAAR) 25 MG tablet    Sig: Take 1 tablet (25 mg total) by mouth daily.    Dispense:  90 tablet    Refill:  3    ZERO refills remain on this prescription. Your patient is requesting advance approval of refills for this medication to [place]    Patient Instructions  Medication Instructions:  No Changes In Medications at this time.  *If you need a refill on your cardiac medications before your next appointment, please call your  pharmacy*  Follow-Up: At [facility], you and your health needs are our priority.  As part of our continuing mission to provide you with exceptional heart care, we have created designated Provider Care Teams.  These Care Teams include your primary Cardiologist (physician) and Advanced Practice Providers (APPs -  Physician Assistants and Nurse Practitioners) who all work together to provide you with the care you need, when you need it.  Your next appointment:   1 year(s)  The format for your next appointment:   In Person  Provider:   Cherlynn Kaiser, MD

## 2020-11-05 NOTE — Patient Instructions (Signed)

## 2020-11-14 DIAGNOSIS — Z113 Encounter for screening for infections with a predominantly sexual mode of transmission: Secondary | ICD-10-CM | POA: Diagnosis not present

## 2020-11-14 DIAGNOSIS — N760 Acute vaginitis: Secondary | ICD-10-CM | POA: Diagnosis not present

## 2020-11-14 DIAGNOSIS — Z6841 Body Mass Index (BMI) 40.0 and over, adult: Secondary | ICD-10-CM | POA: Diagnosis not present

## 2020-12-03 ENCOUNTER — Other Ambulatory Visit: Payer: Self-pay | Admitting: Internal Medicine

## 2020-12-07 DIAGNOSIS — J029 Acute pharyngitis, unspecified: Secondary | ICD-10-CM | POA: Diagnosis not present

## 2021-03-01 DIAGNOSIS — H5213 Myopia, bilateral: Secondary | ICD-10-CM | POA: Diagnosis not present

## 2021-03-01 DIAGNOSIS — H532 Diplopia: Secondary | ICD-10-CM | POA: Diagnosis not present

## 2021-03-01 DIAGNOSIS — E119 Type 2 diabetes mellitus without complications: Secondary | ICD-10-CM | POA: Diagnosis not present

## 2021-07-03 DIAGNOSIS — Z6841 Body Mass Index (BMI) 40.0 and over, adult: Secondary | ICD-10-CM | POA: Diagnosis not present

## 2021-07-03 DIAGNOSIS — N760 Acute vaginitis: Secondary | ICD-10-CM | POA: Diagnosis not present

## 2021-07-03 DIAGNOSIS — Z1231 Encounter for screening mammogram for malignant neoplasm of breast: Secondary | ICD-10-CM | POA: Diagnosis not present

## 2021-07-03 DIAGNOSIS — Z01419 Encounter for gynecological examination (general) (routine) without abnormal findings: Secondary | ICD-10-CM | POA: Diagnosis not present

## 2021-08-18 DIAGNOSIS — J0100 Acute maxillary sinusitis, unspecified: Secondary | ICD-10-CM | POA: Diagnosis not present

## 2021-10-02 DIAGNOSIS — J019 Acute sinusitis, unspecified: Secondary | ICD-10-CM | POA: Diagnosis not present

## 2021-11-06 NOTE — Progress Notes (Signed)
Cardiology Office Note:    Date:  11/07/2021   ID:  Ann [name], DOB 1971/08/03, MRN [id number]  PCP:  Glenis Smoker, MD   [facility] Providers Cardiologist:  Elouise Munroe, MD     Referring MD: Maurice Small, MD   Chief Complaint: follow-up bifascicular block, HTN  History of Present Illness:    Ann [name] is a 51 y.o. female with a hx of DM, relapsing remitting MS, HTN, bifascicular block, and chronic HFpEF.  She established with our group in 2019 following an ED visit for an episode of left arm tightness and tingling. This was shortly after the death of her mother which was attributed to MI. Her ED visit revealed negative troponin, EKG with no changes concerning for MI but with bifascicular block with right bundle branch block and left anterior fascicular block, unchanged from 2017. Echo revealed LVEF 60-65%, mild LVH, no rwma, G1DD, trival PR, trivial TR.  Cardiac monitor revealed no evidence of AV block, no evidence of progressive conduction system block beyond known bifascicular block. Gated exercise myoview revealed no ST segment deviation noted during stress, no evidence of ischemia or previous infarction, low risk study. Additional cardiac monitoring was ordered due to symptom of dizziness during initial 24 hr monitor but the one week monitor did not reveal any further pauses or conduction disease and patient had no dizziness. We are avoiding AV nodal blocking agents in the setting of bifascicular block.   She was last seen in our office by Dr. Margaretann Loveless on 11/05/20. She was tolerating losartan for HTN associated with activity and was overall feeling well with improvement in symptom of dizziness and no syncope. One year follow-up was recommended.  Today, she is here alone for follow-up. States she is feeling well with no occurrences of dizziness. Home BP readings are generally less than 130/80.  She denies  chest pain, shortness of breath, lower extremity edema,  fatigue, palpitations, melena, hematuria, hemoptysis, diaphoresis, weakness, presyncope, syncope, orthopnea, and PND.  Reports no recent changes in medical history.  She is a patient of [facility] but has not seen new PCP since Dr. Justin Mend left.  Has an upcoming appointment with Dr. Lindell Noe in March.  She is not exercising regularly. No specific concerns voiced today.  Past Medical History:  Diagnosis Date   Anxiety    Diabetes mellitus ([place]) 07/08/2018   Diabetes mellitus without complication (HCC)    MS (multiple sclerosis) ([place])     Past Surgical History:  Procedure Laterality Date   APPENDECTOMY     CESAREAN SECTION      Current Medications: Current Meds  Medication Sig   ALPRAZolam (XANAX) 0.25 MG tablet Take 0.25 mg by mouth 2 (two) times daily as needed.   fluticasone (FLONASE) 50 MCG/ACT nasal spray fluticasone propionate 50 mcg/actuation nasal spray,suspension   metFORMIN (GLUCOPHAGE) 500 MG tablet Take 500 mg by mouth 2 (two) times daily with a meal.   norethindrone (MICRONOR) 0.35 MG tablet Take 1 tablet by mouth daily.   ondansetron (ZOFRAN ODT) 4 MG disintegrating tablet Take 1 tablet (4 mg total) by mouth every 8 (eight) hours as needed for nausea or vomiting.   ONETOUCH DELICA LANCETS FINE MISC Use as directed   ONETOUCH VERIO test strip Use as directed   [DISCONTINUED] losartan (COZAAR) 25 MG tablet Take 1 tablet (25 mg total) by mouth daily.     Allergies:   Amoxicillin, Hydrocodone-acetaminophen, and Vicodin [hydrocodone-acetaminophen]   Social History  Socioeconomic History   Marital status: Single    Spouse name: Not on file   Number of children: 1   Years of education: 14   Highest education level: Not on file  Occupational History   Occupation: Social worker  Tobacco Use   Smoking status: Never   Smokeless tobacco: Never  Vaping Use   Vaping Use: Never used  Substance and Sexual Activity   Alcohol use: No   Drug use: No    Sexual activity: Not on file  Other Topics Concern   Not on file  Social History Narrative   Patient lives with mom in a one story home.  Has one daughter.  Works as an Social worker.  Education: BS   Right handed   Social Determinants of Health   Financial Resource Strain: Not on file  Food Insecurity: Not on file  Transportation Needs: Not on file  Physical Activity: Not on file  Stress: Not on file  Social Connections: Not on file     Family History: The patient's family history includes Heart attack in her mother; Sudden death in her mother.  ROS:   Please see the history of present illness.  All other systems reviewed and are negative.  Labs/Other Studies Reviewed:    The following studies were reviewed today:  Cardiac monitor 2/20  IMPRESSION: The patient wore the monitor for 1 week due to discomfort with adhesive. No dizzy spells during monitor. No pauses or worsening of conduction system disease noted. Baseline bifascicular block noted.    Exercise Myoview 10/19  Nuclear stress EF: 65%. The left ventricular ejection fraction is normal (55-65%). There was no ST segment deviation noted during stress. The study is normal. No evidence of ischemia or previous infarction This is a low risk study.  24 Hr Holter Monitor 10/19    IMPRESSION: No evidence of AV block. No evidence of episodes of progressive conduction system block beyond known bifascicular block (RBBB/LAFB). No symptoms recorded in diary.     Echo 10/19  Left ventricle:  Global longitudinal strain is -17.4%.  The cavity size was normal. Wall thickness was increased in a  pattern of mild LVH. Systolic function was normal. The estimated  ejection fraction was in the range of 60% to 65%. Wall motion was  normal; there were no regional wall motion abnormalities. Doppler  parameters are consistent with abnormal left ventricular relaxation  (grade 1 diastolic dysfunction).  Aortic valve:    Structurally normal valve.   Cusp separation was  normal.  Doppler:  Transvalvular velocity was within the normal  range. There was no stenosis. There was no regurgitation.  Aorta: Aortic root: The aortic root was normal in size.  Ascending aorta: The ascending aorta was normal in size.  Mitral valve:   Structurally normal valve.   Leaflet separation was  normal.  Doppler:  Transvalvular velocity was within the normal  range. There was no evidence for stenosis. There was no  regurgitation.    Valve area by pressure half-time: 3.55 cm^2.  Indexed valve area by pressure half-time: 1.34 cm^2/m^2.    Peak  gradient (D): 3 mm Hg.  Left atrium:  The atrium was normal in size.  Right ventricle:  The cavity size was normal. Systolic function was  normal.  Pulmonic valve:    The valve appears to be grossly normal.  Doppler:  There was trivial regurgitation.  Tricuspid valve:   The valve appears to be grossly normal.  Doppler:  There was  trivial regurgitation.  Pulmonary artery:   Systolic pressure was within the normal range.  Right atrium:  The atrium was normal in size.  Pericardium: There was no pericardial effusion.    Recent Labs: No results found for requested labs within last 8760 hours.  Recent Lipid Panel No results found for: CHOL, TRIG, HDL, CHOLHDL, VLDL, LDLCALC, LDLDIRECT   Risk Assessment/Calculations:      Physical Exam:    VS:  BP 130/90    Pulse 98    Ht 5' 4.5" (1.638 m)    Wt 294 lb 6.4 oz (133.5 kg)    SpO2 99%    BMI 49.75 kg/m     Wt Readings from Last 3 Encounters:  11/07/21 294 lb 6.4 oz (133.5 kg)  11/05/20 283 lb (128.4 kg)  12/24/19 299 lb (135.6 kg)     GEN:  Well nourished, well developed in no acute distress HEENT: Normal NECK: No JVD; No carotid bruits CARDIAC: RRR, no murmurs, rubs, gallops RESPIRATORY:  Clear to auscultation without rales, wheezing or rhonchi  ABDOMEN: Soft, non-tender, non-distended MUSCULOSKELETAL:  No edema; No deformity.  2+ pedal pulses, equal bilaterally SKIN: Warm and dry NEUROLOGIC:  Alert and oriented x 3 PSYCHIATRIC:  Normal affect   EKG:  EKG is ordered today.  The ekg ordered today demonstrates NSR at 98 bpm, possible left atrial enlargement, bifascicular block (RBBB, LAFB) no acute change from previous  Diagnoses:    1. Bifascicular block   2. Essential hypertension   3. Chronic heart failure with preserved ejection fraction (HFpEF) ([place])   4. Medication management   5. Class 3 severe obesity due to excess calories without serious comorbidity with body mass index (BMI) of 45.0 to 49.9 in adult ([place])   6. Hyperlipidemia associated with type 2 diabetes mellitus ([place])    Assessment and Plan:     Bifascicular block: Stable by EKG today. HR 98 bpm. Avoiding AV nodal agents due to block. She denies symptoms of dizziness, presyncope, syncope. No changes in activity tolerance or fatigue. Will continue to monitor with regular EKGs.   Essential hypertension: BP slightly elevated today. She reports home readings consistently < 130/80. Continue losartan.   Chronic HFpEF: Echo 10/19 revealed LVEF 60-65%, mild LVH, no rwma, G1DD. Appears euvolemic on exam. No edema, dyspnea, orthopnea, or PND. BP is well-controlled on losartan. Could consider addition of SGLT2 inhibitor in the future. Will await appointment with new PCP in March.   Obesity: BMI 49.75. Encouraged moderate physical activity to achieve 150 minutes per week, weight loss, and heart healthy diet. Offered referral to PREP, she will think about it.   Hyperlipidemia:  Last LDL 144 6/20. We will recheck lipids tomorrow when she can return fasting. If LDL remains > 100, would favor initiation of moderate intensity statin in the setting of diabetes and obesity.   Disposition: 6 months with Dr. Margaretann Loveless   Medication Adjustments/Labs and Tests Ordered: Current medicines are reviewed at length with the patient today.  Concerns regarding medicines are  outlined above.  Orders Placed This Encounter  Procedures   Comprehensive metabolic panel   Lipid Profile   EKG 12-Lead   Meds ordered this encounter  Medications   losartan (COZAAR) 25 MG tablet    Sig: Take 1 tablet (25 mg total) by mouth daily.    Dispense:  90 tablet    Refill:  1    ZERO refills remain on this prescription. Your patient is requesting advance approval of refills for  this medication to PREVENT ANY MISSED DOSES    Patient Instructions  Medication Instructions:  The current medical regimen is effective;  continue present plan and medications as directed. Please refer to the Current Medication list given to you today.   *If you need a refill on your cardiac medications before your next appointment, please call your pharmacy*  Lab Work:    FASTING LIPID AND CMET.  Follow-Up: Your next appointment:  6 month(s) In Person with Elouise Munroe, MD   Please call our office 2 months in advance to schedule this appointment  At [facility], you and your health needs are our priority.  As part of our continuing mission to provide you with exceptional heart care, we have created designated Provider Care Teams.  These Care Teams include your primary Cardiologist (physician) and Advanced Practice Providers (APPs -  Physician Assistants and Nurse Practitioners) who all work together to provide you with the care you need, when you need it.       Signed, Emmaline Life, NP  11/07/2021 12:43 PM    [facility]

## 2021-11-07 ENCOUNTER — Other Ambulatory Visit: Payer: Self-pay

## 2021-11-07 ENCOUNTER — Encounter: Payer: Self-pay | Admitting: General Practice

## 2021-11-07 ENCOUNTER — Ambulatory Visit: Payer: BC Managed Care – PPO | Admitting: Nurse Practitioner

## 2021-11-07 VITALS — BP 130/90 | HR 98 | Ht 64.5 in | Wt 294.4 lb

## 2021-11-07 DIAGNOSIS — Z6841 Body Mass Index (BMI) 40.0 and over, adult: Secondary | ICD-10-CM

## 2021-11-07 DIAGNOSIS — E785 Hyperlipidemia, unspecified: Secondary | ICD-10-CM

## 2021-11-07 DIAGNOSIS — I452 Bifascicular block: Secondary | ICD-10-CM | POA: Diagnosis not present

## 2021-11-07 DIAGNOSIS — I10 Essential (primary) hypertension: Secondary | ICD-10-CM

## 2021-11-07 DIAGNOSIS — I5032 Chronic diastolic (congestive) heart failure: Secondary | ICD-10-CM | POA: Diagnosis not present

## 2021-11-07 DIAGNOSIS — E1169 Type 2 diabetes mellitus with other specified complication: Secondary | ICD-10-CM

## 2021-11-07 DIAGNOSIS — Z79899 Other long term (current) drug therapy: Secondary | ICD-10-CM

## 2021-11-07 MED ORDER — LOSARTAN POTASSIUM 25 MG PO TABS: 25.0000 mg | ORAL_TABLET | Freq: Every day | ORAL | 1 refills | Status: DC

## 2021-11-07 NOTE — Patient Instructions (Signed)
Medication Instructions:  The current medical regimen is effective;  continue present plan and medications as directed. Please refer to the Current Medication list given to you today.   *If you need a refill on your cardiac medications before your next appointment, please call your pharmacy*  Lab Work:    FASTING LIPID AND CMET.  Follow-Up: Your next appointment:  6 month(s) In Person with Elouise Munroe, MD   Please call our office 2 months in advance to schedule this appointment  At [facility], you and your health needs are our priority.  As part of our continuing mission to provide you with exceptional heart care, we have created designated Provider Care Teams.  These Care Teams include your primary Cardiologist (physician) and Advanced Practice Providers (APPs -  Physician Assistants and Nurse Practitioners) who all work together to provide you with the care you need, when you need it.

## 2021-12-11 DIAGNOSIS — M79605 Pain in left leg: Secondary | ICD-10-CM | POA: Diagnosis not present

## 2021-12-11 DIAGNOSIS — M545 Low back pain, unspecified: Secondary | ICD-10-CM | POA: Diagnosis not present

## 2021-12-14 DIAGNOSIS — M545 Low back pain, unspecified: Secondary | ICD-10-CM | POA: Diagnosis not present

## 2021-12-30 DIAGNOSIS — I10 Essential (primary) hypertension: Secondary | ICD-10-CM | POA: Diagnosis not present

## 2021-12-30 DIAGNOSIS — E119 Type 2 diabetes mellitus without complications: Secondary | ICD-10-CM | POA: Diagnosis not present

## 2021-12-30 DIAGNOSIS — Z0000 Encounter for general adult medical examination without abnormal findings: Secondary | ICD-10-CM | POA: Diagnosis not present

## 2021-12-30 DIAGNOSIS — G35 Multiple sclerosis: Secondary | ICD-10-CM | POA: Diagnosis not present

## 2021-12-30 DIAGNOSIS — E7800 Pure hypercholesterolemia, unspecified: Secondary | ICD-10-CM | POA: Diagnosis not present

## 2022-03-04 DIAGNOSIS — H5213 Myopia, bilateral: Secondary | ICD-10-CM | POA: Diagnosis not present

## 2022-03-04 DIAGNOSIS — E119 Type 2 diabetes mellitus without complications: Secondary | ICD-10-CM | POA: Diagnosis not present

## 2022-03-04 DIAGNOSIS — H52203 Unspecified astigmatism, bilateral: Secondary | ICD-10-CM | POA: Diagnosis not present

## 2022-03-21 DIAGNOSIS — R300 Dysuria: Secondary | ICD-10-CM | POA: Diagnosis not present

## 2022-06-09 ENCOUNTER — Other Ambulatory Visit: Payer: Self-pay | Admitting: Internal Medicine

## 2022-06-10 ENCOUNTER — Telehealth: Payer: Self-pay | Admitting: Internal Medicine

## 2022-06-10 MED ORDER — LOSARTAN POTASSIUM 25 MG PO TABS: 25.0000 mg | ORAL_TABLET | Freq: Every day | ORAL | 1 refills | Status: DC

## 2022-06-10 NOTE — Telephone Encounter (Signed)
*  STAT* If patient is at the pharmacy, call can be transferred to refill team.   1. Which medications need to be refilled? (please list name of each medication and dose if known) losartan (COZAAR) 25 MG tablet  2. Which pharmacy/location (including street and city if local pharmacy) is medication to be sent to? [place], [place] - [address] AT [address]  3. Do they need a 30 day or 90 day supply? [place]

## 2022-06-11 DIAGNOSIS — S0502XA Injury of conjunctiva and corneal abrasion without foreign body, left eye, initial encounter: Secondary | ICD-10-CM | POA: Diagnosis not present

## 2022-06-12 DIAGNOSIS — S0502XD Injury of conjunctiva and corneal abrasion without foreign body, left eye, subsequent encounter: Secondary | ICD-10-CM | POA: Diagnosis not present

## 2022-06-24 NOTE — Progress Notes (Unsigned)
Cardiology Office Note:    Date:  06/25/2022   ID:  Ann [name], DOB [birth year], MRN [id number]  PCP:  Glenis Smoker, MD [place] Cardiologist: Elouise Munroe, MD   Reason for visit: 6 mo follow-up  History of Present Illness:    Ann [name] is a 51 y.o. female with a hx of DM, relapsing remitting MS, HTN, bifascicular block, and chronic HFpEF.   She established with our group in 2019 following an ED visit for an episode of left arm tightness and tingling. This was shortly after the death of her mother which was attributed to MI. Her ED visit revealed negative troponin, EKG with no changes concerning for MI but with bifascicular block with right bundle branch block and left anterior fascicular block, unchanged from 2017. Echo revealed LVEF 60-65%, mild LVH, no rwma, G1DD, trival PR, trivial TR.  Cardiac monitor revealed no evidence of AV block, no evidence of progressive conduction system block beyond known bifascicular block. Gated exercise myoview revealed no ST segment deviation noted during stress, no evidence of ischemia or previous infarction, low risk study. Additional cardiac monitoring was ordered due to symptom of dizziness during initial 24 hr monitor but the one week monitor did not reveal any further pauses or conduction disease and patient had no dizziness. We are avoiding AV nodal blocking agents in the setting of bifascicular block.   Patient was last seen in March 2023 feeling well with no concerns.  With a BMI of 49, weight loss and exercise were recommended.  Lipids were ordered but not done.  Today, patient states she is feeling well.  She denies lightheadedness, syncope, chest pain, shortness of breath, PND, orthopnea and lower extremity edema.  She has lost 10 pounds since her visit in February.  She had lipids in April with the LDL of 142 and triglycerides 248.  She has started Crestor 10 mg daily.   Patient states blood pressure at home  120s over 70s.   Past Medical History:  Diagnosis Date   Anxiety    Diabetes mellitus ([place]) 07/08/2018   Diabetes mellitus without complication (HCC)    MS (multiple sclerosis) ([place])     Past Surgical History:  Procedure Laterality Date   APPENDECTOMY     CESAREAN SECTION      Current Medications: Current Meds  Medication Sig   ALPRAZolam (XANAX) 0.25 MG tablet Take 0.25 mg by mouth 2 (two) times daily as needed.   fluticasone (FLONASE) 50 MCG/ACT nasal spray fluticasone propionate 50 mcg/actuation nasal spray,suspension   losartan (COZAAR) 25 MG tablet Take 1 tablet (25 mg total) by mouth daily.   norethindrone-ethinyl estradiol-FE (LOESTRIN FE) 1-20 MG-MCG tablet Take 1 tablet by mouth daily.   rosuvastatin (CRESTOR) 10 MG tablet Take 10 mg by mouth daily.   [DISCONTINUED] metFORMIN (GLUCOPHAGE) 500 MG tablet Take 500 mg by mouth 2 (two) times daily with a meal.   [DISCONTINUED] norethindrone (MICRONOR) 0.35 MG tablet Take 1 tablet by mouth daily.   [DISCONTINUED] ondansetron (ZOFRAN ODT) 4 MG disintegrating tablet Take 1 tablet (4 mg total) by mouth every 8 (eight) hours as needed for nausea or vomiting.   [DISCONTINUED] ONETOUCH DELICA LANCETS FINE MISC Use as directed   [DISCONTINUED] ONETOUCH VERIO test strip Use as directed     Allergies:   Amoxicillin, Hydrocodone-acetaminophen, and Vicodin [hydrocodone-acetaminophen]   Social History   Socioeconomic History   Marital status: Single    Spouse name: Not on file  Number of children: 1   Years of education: 14   Highest education level: Not on file  Occupational History   Occupation: Social worker  Tobacco Use   Smoking status: Never   Smokeless tobacco: Never  Vaping Use   Vaping Use: Never used  Substance and Sexual Activity   Alcohol use: No   Drug use: No   Sexual activity: Not on file  Other Topics Concern   Not on file  Social History Narrative   Patient lives with mom in a one story home.   Has one daughter.  Works as an Social worker.  Education: BS   Right handed   Social Determinants of Health   Financial Resource Strain: Not on file  Food Insecurity: Not on file  Transportation Needs: Not on file  Physical Activity: Not on file  Stress: Not on file  Social Connections: Not on file     Family History: The patient's family history includes Heart attack in her mother; Sudden death in her mother.  ROS:   Please see the history of present illness.     EKGs/Labs/Other Studies Reviewed:    Recent Labs: No results found for requested labs within last 365 days.   Recent Lipid Panel No results found for: "CHOL", "TRIG", "HDL", "LDLCALC", "LDLDIRECT"  Physical Exam:    VS:  BP 136/82   Pulse 92   Ht 5' 4.5" (1.638 m)   Wt 284 lb (128.8 kg)   SpO2 100%   BMI 48.00 kg/m    No data found.       Wt Readings from Last 3 Encounters:  06/25/22 284 lb (128.8 kg)  11/07/21 294 lb 6.4 oz (133.5 kg)  11/05/20 283 lb (128.4 kg)     GEN:  Well nourished, well developed in no acute distress, obese HEENT: Normal NECK: No JVD; No carotid bruits CARDIAC: RRR, no murmurs, rubs, gallops RESPIRATORY:  Clear to auscultation without rales, wheezing or rhonchi  ABDOMEN: Soft, non-tender, non-distended MUSCULOSKELETAL: No edema; No deformity  SKIN: Warm and dry NEUROLOGIC:  Alert and oriented PSYCHIATRIC:  Normal affect     ASSESSMENT AND PLAN   Preventive care -ASCVD 10 year risk:14% -Main goal for her is better lipid control and weight loss.  Bifascicular block, asymptomatic -Avoid AV nodal blocking agents  Hypertension, well controlled -Continue losartan 25 mg daily.  Patient had normal kidney function potassium April 2023. -Goal BP is <130/80.  Recommend DASH diet (high in vegetables, fruits, low-fat dairy products, whole grains, poultry, fish, and nuts and low in sweets, sugar-sweetened beverages, and red meats), salt restriction and increase physical  activity.  Hyperlipidemia with goal LDL less than 100 -LDL 142 in April 2023.  New start on Crestor '10mg'$  daily. -Repeat fasting lipids today. -Discussed cholesterol lowering diets - Mediterranean diet, DASH diet, vegetarian diet, low-carbohydrate diet and avoidance of trans fats.  Discussed healthier choice substitutes.  Nuts, high-fiber foods, and fiber supplements may also improve lipids.    Diabetes -Hemoglobin A1c 6.5% in April 2023. -On metformin.  Morbid obesity with BMI 48 -Discussed how even a 5-10% weight loss can have cardiovascular benefits.   -Recommend moderate intensity activity for 30 minutes 5 days/week and the DASH diet. -Refer to our prep exercise program with the [facility]. -Refer to the [facility]  Disposition - Follow-up in 6 months with Dr. Margaretann Loveless.  Continue to work on reducing cardiac risk factors.   Medication Adjustments/Labs and Tests Ordered: Current medicines are  reviewed at length with the patient today.  Concerns regarding medicines are outlined above.  Orders Placed This Encounter  Procedures   Lipid panel   Amb Ref to Medical Weight Management   Amb Referral To Provider Referral Exercise Program (P.R.E.P)   No orders of the defined types were placed in this encounter.   Patient Instructions  Medication Instructions:  The current medical regimen is effective;  continue present plan and medications.  *If you need a refill on your cardiac medications before your next appointment, please call your pharmacy*   Lab Work: LIPID today   If you have labs (blood work) drawn today and your tests are completely normal, you will receive your results only by: [place] (if you have MyChart) OR A paper copy in the mail If you have any lab test that is abnormal or we need to change your treatment, we will call you to review the results.   Follow-Up: At [facility], you and your health needs are our priority.  As  part of our continuing mission to provide you with exceptional heart care, we have created designated Provider Care Teams.  These Care Teams include your primary Cardiologist (physician) and Advanced Practice Providers (APPs -  Physician Assistants and Nurse Practitioners) who all work together to provide you with the care you need, when you need it.  We recommend signing up for the patient portal called "MyChart".  Sign up information is provided on this After Visit Summary.  MyChart is used to connect with patients for Virtual Visits (Telemedicine).  Patients are able to view lab/test results, encounter notes, upcoming appointments, etc.  Non-urgent messages can be sent to your provider as well.   To learn more about what you can do with MyChart, go to [email or web address].    Your next appointment:   6 month(s)  The format for your next appointment:   In Person  Provider:   Elouise Munroe, MD     Referral to [place].E.P program- they will call you about this.  Referral to Weight Management- they will call you about this.       Signed, Warren Lacy, PA-C  06/25/2022 12:33 PM    [facility]

## 2022-06-25 ENCOUNTER — Ambulatory Visit: Payer: BC Managed Care – PPO | Attending: Physician Assistant | Admitting: Physician Assistant

## 2022-06-25 ENCOUNTER — Encounter: Payer: Self-pay | Admitting: Physician Assistant

## 2022-06-25 VITALS — BP 136/82 | HR 92 | Ht 64.5 in | Wt 284.0 lb

## 2022-06-25 DIAGNOSIS — E1169 Type 2 diabetes mellitus with other specified complication: Secondary | ICD-10-CM

## 2022-06-25 DIAGNOSIS — I452 Bifascicular block: Secondary | ICD-10-CM | POA: Diagnosis not present

## 2022-06-25 DIAGNOSIS — E785 Hyperlipidemia, unspecified: Secondary | ICD-10-CM | POA: Diagnosis not present

## 2022-06-25 NOTE — Patient Instructions (Signed)
Medication Instructions:  The current medical regimen is effective;  continue present plan and medications.  *If you need a refill on your cardiac medications before your next appointment, please call your pharmacy*   Lab Work: LIPID today   If you have labs (blood work) drawn today and your tests are completely normal, you will receive your results only by: [place] (if you have MyChart) OR A paper copy in the mail If you have any lab test that is abnormal or we need to change your treatment, we will call you to review the results.   Follow-Up: At [facility], you and your health needs are our priority.  As part of our continuing mission to provide you with exceptional heart care, we have created designated Provider Care Teams.  These Care Teams include your primary Cardiologist (physician) and Advanced Practice Providers (APPs -  Physician Assistants and Nurse Practitioners) who all work together to provide you with the care you need, when you need it.  We recommend signing up for the patient portal called "MyChart".  Sign up information is provided on this After Visit Summary.  MyChart is used to connect with patients for Virtual Visits (Telemedicine).  Patients are able to view lab/test results, encounter notes, upcoming appointments, etc.  Non-urgent messages can be sent to your provider as well.   To learn more about what you can do with MyChart, go to [email or web address].    Your next appointment:   6 month(s)  The format for your next appointment:   In Person  Provider:   Elouise Munroe, MD     Referral to [place].E.P program- they will call you about this.  Referral to Weight Management- they will call you about this.

## 2022-06-26 DIAGNOSIS — E785 Hyperlipidemia, unspecified: Secondary | ICD-10-CM | POA: Diagnosis not present

## 2022-06-26 DIAGNOSIS — E1169 Type 2 diabetes mellitus with other specified complication: Secondary | ICD-10-CM | POA: Diagnosis not present

## 2022-06-26 LAB — LIPID PANEL
Chol/HDL Ratio: 3 ratio (ref 0.0–4.4)
Cholesterol, Total: 127 mg/dL (ref 100–199)
HDL: 43 mg/dL (ref >39)
LDL Chol Calc (NIH): 58 mg/dL (ref 0–99)
Triglycerides: 153 mg/dL — ABNORMAL HIGH (ref 0–149)
VLDL Cholesterol Cal: 26 mg/dL (ref 5–40)

## 2022-07-01 DIAGNOSIS — Z23 Encounter for immunization: Secondary | ICD-10-CM | POA: Diagnosis not present

## 2022-07-01 DIAGNOSIS — E7800 Pure hypercholesterolemia, unspecified: Secondary | ICD-10-CM | POA: Diagnosis not present

## 2022-07-01 DIAGNOSIS — I10 Essential (primary) hypertension: Secondary | ICD-10-CM | POA: Diagnosis not present

## 2022-07-01 DIAGNOSIS — E119 Type 2 diabetes mellitus without complications: Secondary | ICD-10-CM | POA: Diagnosis not present

## 2022-07-09 ENCOUNTER — Ambulatory Visit: Payer: Self-pay | Admitting: Podiatry

## 2022-07-09 ENCOUNTER — Encounter: Payer: Self-pay | Admitting: Podiatry

## 2022-07-09 DIAGNOSIS — L600 Ingrowing nail: Secondary | ICD-10-CM

## 2022-07-09 NOTE — Patient Instructions (Signed)

## 2022-07-09 NOTE — Progress Notes (Signed)
Subjective:   Patient ID: Ann [name], female   DOB: 51 y.o.   MRN: [id number]   HPI Patient states she developed a painful ingrown toenail of the right big toe and states that its been real sore for the last week with no drainage.  Patient does not smoke likes to be active   Review of Systems  All other systems reviewed and are negative.       Objective:  Physical Exam Vitals and nursing note reviewed.  Constitutional:      Appearance: She is well-developed.  Pulmonary:     Effort: Pulmonary effort is normal.  Musculoskeletal:        General: Normal range of motion.  Skin:    General: Skin is warm.  Neurological:     Mental Status: She is alert.     Neurovascular status intact muscle strength adequate range of motion adequate patient found to have incurvated right hallux medial border painful when pressed no active drainage or redness associated with it but it is sore with good digital perfusion well-oriented     Assessment:  Ink toenail deformity right hallux medial border with pain     Plan:  Reviewed condition recommended correction allowed her to read then signed consent form infiltrated the right hallux 60 mg like Marcaine mixture sterile prep done using sterile instrumentation remove the medial border exposed matrix applied phenol 3 applications 30 seconds followed by alcohol by sterile dressing gave instructions on soaks and to leave dressing on 24 hours take it off earlier if throbbing were to occur and encouraged to call questions concerns

## 2022-07-18 DIAGNOSIS — Z1231 Encounter for screening mammogram for malignant neoplasm of breast: Secondary | ICD-10-CM | POA: Diagnosis not present

## 2022-07-18 DIAGNOSIS — Z01419 Encounter for gynecological examination (general) (routine) without abnormal findings: Secondary | ICD-10-CM | POA: Diagnosis not present

## 2022-07-18 DIAGNOSIS — Z6841 Body Mass Index (BMI) 40.0 and over, adult: Secondary | ICD-10-CM | POA: Diagnosis not present

## 2022-07-29 ENCOUNTER — Encounter (INDEPENDENT_AMBULATORY_CARE_PROVIDER_SITE_OTHER): Payer: Self-pay

## 2022-08-06 DIAGNOSIS — M25552 Pain in left hip: Secondary | ICD-10-CM | POA: Diagnosis not present

## 2022-08-06 DIAGNOSIS — M79652 Pain in left thigh: Secondary | ICD-10-CM | POA: Diagnosis not present

## 2022-08-25 DIAGNOSIS — M25552 Pain in left hip: Secondary | ICD-10-CM | POA: Diagnosis not present

## 2022-09-07 ENCOUNTER — Other Ambulatory Visit: Payer: Self-pay | Admitting: Internal Medicine

## 2022-10-08 DIAGNOSIS — N950 Postmenopausal bleeding: Secondary | ICD-10-CM | POA: Diagnosis not present

## 2022-10-10 DIAGNOSIS — M25552 Pain in left hip: Secondary | ICD-10-CM | POA: Diagnosis not present

## 2022-11-05 DIAGNOSIS — R399 Unspecified symptoms and signs involving the genitourinary system: Secondary | ICD-10-CM | POA: Diagnosis not present

## 2022-12-08 ENCOUNTER — Other Ambulatory Visit: Payer: Self-pay | Admitting: Internal Medicine

## 2022-12-10 ENCOUNTER — Telehealth: Payer: Self-pay | Admitting: Internal Medicine

## 2022-12-10 NOTE — Telephone Encounter (Signed)
*  STAT* If patient is at the pharmacy, call can be transferred to refill team.   1. Which medications need to be refilled? (please list name of each medication and dose if known)   losartan (COZAAR) 25 MG tablet    2. Which pharmacy/location (including street and city if local pharmacy) is medication to be sent to?  [facility], [place] - [address] AT [place]    3. Do they need a 30 day or 90 day supply? 90 day   Pt is completely out of medication. Please advise

## 2022-12-11 MED ORDER — LOSARTAN POTASSIUM 25 MG PO TABS: 25.0000 mg | ORAL_TABLET | Freq: Every day | ORAL | 1 refills | Status: DC

## 2022-12-26 DIAGNOSIS — J029 Acute pharyngitis, unspecified: Secondary | ICD-10-CM | POA: Diagnosis not present

## 2022-12-26 DIAGNOSIS — R519 Headache, unspecified: Secondary | ICD-10-CM | POA: Diagnosis not present

## 2022-12-26 DIAGNOSIS — B349 Viral infection, unspecified: Secondary | ICD-10-CM | POA: Diagnosis not present

## 2022-12-26 DIAGNOSIS — R059 Cough, unspecified: Secondary | ICD-10-CM | POA: Diagnosis not present

## 2022-12-30 NOTE — Progress Notes (Unsigned)
Cardiology Office Note:    Date:  12/31/2022   ID:  Ann [name], DOB June 13, 1971, MRN [id number]  PCP:  Glenis Smoker, MD [facility] Cardiologist: Elouise Munroe, MD   Reason for visit: 103-month follow-up  History of Present Illness:    Ann [name] is a 52 y.o. female with a hx of  DM, relapsing remitting MS, HTN, bifascicular block, and chronic HFpEF.   She established with our group in 2019 following an ED visit for an episode of left arm tightness and tingling. This was shortly after the death of her mother which was attributed to MI. Her ED visit revealed negative troponin, EKG with no changes concerning for MI but with bifascicular block with right bundle branch block and left anterior fascicular block, unchanged from 2017. Echo revealed LVEF 60-65%, mild LVH, no rwma, G1DD, trival PR, trivial TR.  Cardiac monitor revealed no evidence of AV block, no evidence of progressive conduction system block beyond known bifascicular block. Gated exercise myoview revealed no ST segment deviation noted during stress, no evidence of ischemia or previous infarction, low risk study. Additional cardiac monitoring was ordered due to symptom of dizziness during initial 24 hr monitor but the one week monitor did not reveal any further pauses or conduction disease and patient had no dizziness. We are avoiding AV nodal blocking agents in the setting of bifascicular block.    I last saw her in September 2023.  She reported 10 pounds of weight loss and had started Crestor 10 mg daily.  Lipids showed LDL down from 142 to 58.  Today, she is doing well without chest pain, shortness of breath, pedal edema, lightheadedness, & syncope.  No issues with her medications.  She did not follow-up on referral to the center for healthy weight & wellness or [facility] prep program.           Past Medical History:  Diagnosis Date   Anxiety    Diabetes mellitus 07/08/2018   Diabetes mellitus without  complication    MS (multiple sclerosis)     Past Surgical History:  Procedure Laterality Date   APPENDECTOMY     CESAREAN SECTION      Current Medications: Current Meds  Medication Sig   ALPRAZolam (XANAX) 0.25 MG tablet Take 0.25 mg by mouth 2 (two) times daily as needed.   fluticasone (FLONASE) 50 MCG/ACT nasal spray fluticasone propionate 50 mcg/actuation nasal spray,suspension   losartan (COZAAR) 25 MG tablet Take 1 tablet (25 mg total) by mouth daily.   norethindrone-ethinyl estradiol-FE (LOESTRIN FE) 1-20 MG-MCG tablet Take 1 tablet by mouth daily.   rosuvastatin (CRESTOR) 10 MG tablet Take 10 mg by mouth daily.     Allergies:   Amoxicillin, Hydrocodone-acetaminophen, and Vicodin [hydrocodone-acetaminophen]   Social History   Socioeconomic History   Marital status: Single    Spouse name: Not on file   Number of children: 1   Years of education: 14   Highest education level: Not on file  Occupational History   Occupation: Social worker  Tobacco Use   Smoking status: Never   Smokeless tobacco: Never  Vaping Use   Vaping Use: Never used  Substance and Sexual Activity   Alcohol use: No   Drug use: No   Sexual activity: Not on file  Other Topics Concern   Not on file  Social History Narrative   Patient lives with mom in a one story home.  Has one daughter.  Works as an Land  reviewer.  Education: BS   Right handed   Social Determinants of Health   Financial Resource Strain: Not on file  Food Insecurity: Not on file  Transportation Needs: Not on file  Physical Activity: Not on file  Stress: Not on file  Social Connections: Not on file     Family History: The patient's family history includes Heart attack in her mother; Sudden death in her mother.  ROS:   Please see the history of present illness.     EKGs/Labs/Other Studies Reviewed:    EKG:  The ekg ordered today demonstrates normal sinus rhythm with heart rate 77.  Right bundle branch  block, left anterior fascicular block.  Recent Labs: No results found for requested labs within last 365 days.   Recent Lipid Panel Lab Results  Component Value Date/Time   CHOL 127 06/26/2022 09:21 AM   TRIG 153 (H) 06/26/2022 09:21 AM   HDL 43 06/26/2022 09:21 AM   LDLCALC 58 06/26/2022 09:21 AM    Physical Exam:    VS:  BP 138/88   Pulse 77   Ht 5' 4.5" (1.638 m)   Wt 283 lb (128.4 kg)   BMI 47.83 kg/m    No data found.   Wt Readings from Last 3 Encounters:  12/31/22 283 lb (128.4 kg)  06/25/22 284 lb (128.8 kg)  11/07/21 294 lb 6.4 oz (133.5 kg)     GEN:  Well nourished, well developed in no acute distress, obese HEENT: Normal NECK: No JVD; No carotid bruits CARDIAC: RRR, no murmurs, rubs, gallops RESPIRATORY:  Clear to auscultation without rales, wheezing or rhonchi  ABDOMEN: Soft, non-tender, non-distended MUSCULOSKELETAL: No edema SKIN: Warm and dry NEUROLOGIC:  Alert and oriented PSYCHIATRIC:  Normal affect     ASSESSMENT AND PLAN   Bifascicular block, asymptomatic -Avoid AV nodal blocking agents   Hypertension, well controlled -BP at home 120s over 70s.  Continue losartan 25 mg daily. -Goal BP is <130/80.  Recommend DASH diet (high in vegetables, fruits, low-fat dairy products, whole grains, poultry, fish, and nuts and low in sweets, sugar-sweetened beverages, and red meats), salt restriction and increase physical activity.   Hyperlipidemia with goal LDL less than 100 -LDL 58 in 05/2022 -Continue Crestor.  Check fasting lipids again in 1 year. -Encouraged cholesterol lowering diets - Mediterranean diet, DASH diet, vegetarian diet, low-carbohydrate diet and avoidance of trans fats.  Discussed healthier choice substitutes.  Nuts, high-fiber foods, and fiber supplements may also improve lipids.      Morbid obesity with BMI 48 -Recommend healthy cardiac diet and regular activity. -Declined referral to our prep exercise program with the [facility] & center of  healthy weight and wellness   Disposition - Follow-up in 1 year.   Medication Adjustments/Labs and Tests Ordered: Current medicines are reviewed at length with the patient today.  Concerns regarding medicines are outlined above.  Orders Placed This Encounter  Procedures   Lipid panel   EKG 12-Lead   No orders of the defined types were placed in this encounter.   Patient Instructions  Medication Instructions:  No changes *If you need a refill on your cardiac medications before your next appointment, please call your pharmacy*   Lab Work: Lipid Panel 1 year If you have labs (blood work) drawn today and your tests are completely normal, you will receive your results only by: [place] (if you have MyChart) OR A paper copy in the mail If you have any lab test that is abnormal or  we need to change your treatment, we will call you to review the results.   Testing/Procedures: No Testing   Follow-Up: At [facility], you and your health needs are our priority.  As part of our continuing mission to provide you with exceptional heart care, we have created designated Provider Care Teams.  These Care Teams include your primary Cardiologist (physician) and Advanced Practice Providers (APPs -  Physician Assistants and Nurse Practitioners) who all work together to provide you with the care you need, when you need it.  We recommend signing up for the patient portal called "MyChart".  Sign up information is provided on this After Visit Summary.  MyChart is used to connect with patients for Virtual Visits (Telemedicine).  Patients are able to view lab/test results, encounter notes, upcoming appointments, etc.  Non-urgent messages can be sent to your provider as well.   To learn more about what you can do with MyChart, go to [email or web address].    Your next appointment:   1 year(s)  Provider:   Elouise Munroe, MD     Signed, Warren Lacy, PA-C  12/31/2022 10:00  AM    [place]

## 2022-12-31 ENCOUNTER — Ambulatory Visit: Payer: BC Managed Care – PPO | Attending: Physician Assistant | Admitting: Physician Assistant

## 2022-12-31 ENCOUNTER — Encounter: Payer: Self-pay | Admitting: Physician Assistant

## 2022-12-31 VITALS — BP 138/88 | HR 77 | Ht 64.5 in | Wt 283.0 lb

## 2022-12-31 DIAGNOSIS — E785 Hyperlipidemia, unspecified: Secondary | ICD-10-CM

## 2022-12-31 DIAGNOSIS — E1169 Type 2 diabetes mellitus with other specified complication: Secondary | ICD-10-CM

## 2022-12-31 DIAGNOSIS — I10 Essential (primary) hypertension: Secondary | ICD-10-CM | POA: Diagnosis not present

## 2022-12-31 DIAGNOSIS — I452 Bifascicular block: Secondary | ICD-10-CM

## 2022-12-31 NOTE — Patient Instructions (Signed)
Medication Instructions:  No changes *If you need a refill on your cardiac medications before your next appointment, please call your pharmacy*   Lab Work: Lipid Panel 1 year If you have labs (blood work) drawn today and your tests are completely normal, you will receive your results only by: [place] (if you have MyChart) OR A paper copy in the mail If you have any lab test that is abnormal or we need to change your treatment, we will call you to review the results.   Testing/Procedures: No Testing   Follow-Up: At [facility], you and your health needs are our priority.  As part of our continuing mission to provide you with exceptional heart care, we have created designated Provider Care Teams.  These Care Teams include your primary Cardiologist (physician) and Advanced Practice Providers (APPs -  Physician Assistants and Nurse Practitioners) who all work together to provide you with the care you need, when you need it.  We recommend signing up for the patient portal called "MyChart".  Sign up information is provided on this After Visit Summary.  MyChart is used to connect with patients for Virtual Visits (Telemedicine).  Patients are able to view lab/test results, encounter notes, upcoming appointments, etc.  Non-urgent messages can be sent to your provider as well.   To learn more about what you can do with MyChart, go to [email or web address].    Your next appointment:   1 year(s)  Provider:   Elouise Munroe, MD

## 2023-01-05 DIAGNOSIS — I10 Essential (primary) hypertension: Secondary | ICD-10-CM | POA: Diagnosis not present

## 2023-01-05 DIAGNOSIS — E7800 Pure hypercholesterolemia, unspecified: Secondary | ICD-10-CM | POA: Diagnosis not present

## 2023-01-05 DIAGNOSIS — E559 Vitamin D deficiency, unspecified: Secondary | ICD-10-CM | POA: Diagnosis not present

## 2023-01-05 DIAGNOSIS — Z0000 Encounter for general adult medical examination without abnormal findings: Secondary | ICD-10-CM | POA: Diagnosis not present

## 2023-01-05 DIAGNOSIS — E119 Type 2 diabetes mellitus without complications: Secondary | ICD-10-CM | POA: Diagnosis not present

## 2023-01-22 ENCOUNTER — Ambulatory Visit: Payer: BC Managed Care – PPO | Admitting: Podiatry

## 2023-01-22 ENCOUNTER — Encounter: Payer: Self-pay | Admitting: Podiatry

## 2023-01-22 DIAGNOSIS — L03031 Cellulitis of right toe: Secondary | ICD-10-CM | POA: Diagnosis not present

## 2023-01-22 NOTE — Progress Notes (Signed)
Subjective:   Patient ID: Ann [name], female   DOB: 52 y.o.   MRN: [id number]   HPI Patient presents with concerns about some redness on the medial border of the right big toe stating that it has been a little bit sore even though it is feeling better today   ROS      Objective:  Physical Exam  Neurovascular status intact slight redness on the inside of the right hallux where a previous ingrown was done 6 months ago but it is localized and no proximal edema erythema drainage noted     Assessment:  Local paronychia of the right hallux medial side but appears to be low-grade and improving     Plan:  Reviewed condition were just given go ahead and have her soak it and I did place on antibiotic for a week doxycycline.  Reappoint if symptoms indicate may have to open the area if it continues to persist

## 2023-01-22 NOTE — Patient Instructions (Signed)

## 2023-02-05 ENCOUNTER — Other Ambulatory Visit: Payer: Self-pay | Admitting: Podiatry

## 2023-02-05 MED ORDER — DOXYCYCLINE HYCLATE 100 MG PO TABS: 100.0000 mg | ORAL_TABLET | Freq: Two times a day (BID) | ORAL | 0 refills | Status: AC

## 2023-03-06 DIAGNOSIS — H2513 Age-related nuclear cataract, bilateral: Secondary | ICD-10-CM | POA: Diagnosis not present

## 2023-03-06 DIAGNOSIS — H31003 Unspecified chorioretinal scars, bilateral: Secondary | ICD-10-CM | POA: Diagnosis not present

## 2023-03-06 DIAGNOSIS — H52203 Unspecified astigmatism, bilateral: Secondary | ICD-10-CM | POA: Diagnosis not present

## 2023-03-06 DIAGNOSIS — E119 Type 2 diabetes mellitus without complications: Secondary | ICD-10-CM | POA: Diagnosis not present

## 2023-03-06 DIAGNOSIS — H5213 Myopia, bilateral: Secondary | ICD-10-CM | POA: Diagnosis not present

## 2023-03-26 DIAGNOSIS — R209 Unspecified disturbances of skin sensation: Secondary | ICD-10-CM | POA: Diagnosis not present

## 2023-03-26 DIAGNOSIS — E119 Type 2 diabetes mellitus without complications: Secondary | ICD-10-CM | POA: Diagnosis not present

## 2023-03-26 DIAGNOSIS — J3489 Other specified disorders of nose and nasal sinuses: Secondary | ICD-10-CM | POA: Diagnosis not present

## 2023-06-08 ENCOUNTER — Other Ambulatory Visit: Payer: Self-pay

## 2023-06-08 MED ORDER — LOSARTAN POTASSIUM 25 MG PO TABS: 25.0000 mg | ORAL_TABLET | Freq: Every day | ORAL | 2 refills | Status: DC

## 2023-07-07 DIAGNOSIS — I10 Essential (primary) hypertension: Secondary | ICD-10-CM | POA: Diagnosis not present

## 2023-07-07 DIAGNOSIS — Z23 Encounter for immunization: Secondary | ICD-10-CM | POA: Diagnosis not present

## 2023-07-07 DIAGNOSIS — E119 Type 2 diabetes mellitus without complications: Secondary | ICD-10-CM | POA: Diagnosis not present

## 2023-07-07 DIAGNOSIS — E559 Vitamin D deficiency, unspecified: Secondary | ICD-10-CM | POA: Diagnosis not present

## 2023-08-18 DIAGNOSIS — Z01419 Encounter for gynecological examination (general) (routine) without abnormal findings: Secondary | ICD-10-CM | POA: Diagnosis not present

## 2023-08-18 DIAGNOSIS — Z124 Encounter for screening for malignant neoplasm of cervix: Secondary | ICD-10-CM | POA: Diagnosis not present

## 2023-08-18 DIAGNOSIS — Z1231 Encounter for screening mammogram for malignant neoplasm of breast: Secondary | ICD-10-CM | POA: Diagnosis not present

## 2023-08-18 DIAGNOSIS — Z113 Encounter for screening for infections with a predominantly sexual mode of transmission: Secondary | ICD-10-CM | POA: Diagnosis not present

## 2023-09-17 ENCOUNTER — Ambulatory Visit
Admission: RE | Admit: 2023-09-17 | Discharge: 2023-09-17 | Disposition: A | Discharge status: Home / Self Care | Payer: BC Managed Care – PPO | Source: Ambulatory Visit | Attending: Family Medicine | Admitting: Family Medicine

## 2023-09-17 ENCOUNTER — Other Ambulatory Visit: Payer: Self-pay | Admitting: Family Medicine

## 2023-09-17 DIAGNOSIS — M25512 Pain in left shoulder: Secondary | ICD-10-CM

## 2023-09-17 DIAGNOSIS — M25612 Stiffness of left shoulder, not elsewhere classified: Secondary | ICD-10-CM | POA: Diagnosis not present

## 2023-12-10 DIAGNOSIS — N939 Abnormal uterine and vaginal bleeding, unspecified: Secondary | ICD-10-CM | POA: Diagnosis not present

## 2023-12-10 DIAGNOSIS — N950 Postmenopausal bleeding: Secondary | ICD-10-CM | POA: Diagnosis not present

## 2023-12-16 DIAGNOSIS — M67912 Unspecified disorder of synovium and tendon, left shoulder: Secondary | ICD-10-CM | POA: Diagnosis not present

## 2024-01-08 ENCOUNTER — Other Ambulatory Visit: Payer: Self-pay | Admitting: Obstetrics and Gynecology

## 2024-01-08 DIAGNOSIS — Z01818 Encounter for other preprocedural examination: Secondary | ICD-10-CM

## 2024-01-13 DIAGNOSIS — I10 Essential (primary) hypertension: Secondary | ICD-10-CM | POA: Diagnosis not present

## 2024-01-13 DIAGNOSIS — Z0000 Encounter for general adult medical examination without abnormal findings: Secondary | ICD-10-CM | POA: Diagnosis not present

## 2024-01-13 DIAGNOSIS — Z23 Encounter for immunization: Secondary | ICD-10-CM | POA: Diagnosis not present

## 2024-01-13 DIAGNOSIS — E559 Vitamin D deficiency, unspecified: Secondary | ICD-10-CM | POA: Diagnosis not present

## 2024-01-13 DIAGNOSIS — E119 Type 2 diabetes mellitus without complications: Secondary | ICD-10-CM | POA: Diagnosis not present

## 2024-01-13 DIAGNOSIS — E7800 Pure hypercholesterolemia, unspecified: Secondary | ICD-10-CM | POA: Diagnosis not present

## 2024-02-02 ENCOUNTER — Other Ambulatory Visit: Payer: Self-pay | Admitting: Obstetrics and Gynecology

## 2024-02-08 DIAGNOSIS — R10814 Left lower quadrant abdominal tenderness: Secondary | ICD-10-CM | POA: Diagnosis not present

## 2024-02-08 DIAGNOSIS — R10813 Right lower quadrant abdominal tenderness: Secondary | ICD-10-CM | POA: Diagnosis not present

## 2024-02-08 DIAGNOSIS — Z113 Encounter for screening for infections with a predominantly sexual mode of transmission: Secondary | ICD-10-CM | POA: Diagnosis not present

## 2024-02-08 DIAGNOSIS — R102 Pelvic and perineal pain: Secondary | ICD-10-CM | POA: Diagnosis not present

## 2024-02-08 DIAGNOSIS — R10819 Abdominal tenderness, unspecified site: Secondary | ICD-10-CM | POA: Diagnosis not present

## 2024-02-11 DIAGNOSIS — R102 Pelvic and perineal pain: Secondary | ICD-10-CM | POA: Diagnosis not present

## 2024-02-25 ENCOUNTER — Encounter (HOSPITAL_COMMUNITY): Payer: Self-pay | Admitting: Obstetrics and Gynecology

## 2024-02-25 NOTE — Progress Notes (Signed)
 Spoke w/ via phone for pre-op interview--- Sherian Dimitri Lab needs dos----  CBC and T&S per surgeon. EKG, BMP, A1C and CBG per anesthesia.       Lab results------ COVID test -----patient states asymptomatic no test needed Arrive at -------0530 NPO after MN NO Solid Food.   Pre-Surgery Ensure or G2:  Med rec completed Medications to take morning of surgery ----- Xanax-PRN Diabetic medication -----NONE AM of surgery  GLP1 agonist last dose: GLP1 instructions:  Patient instructed no nail polish to be worn day of surgery Patient instructed to bring photo id and insurance card day of surgery Patient aware to have Driver (ride ) / caregiver    for 24 hours after surgery - Best friend Jullie Oiler Patient Special Instructions -----Shower with antibacterial soap. Pre-Op special Instructions -----  Patient verbalized understanding of instructions that were given at this phone interview. Patient denies chest pain, sob, fever, cough at the interview.

## 2024-02-29 DIAGNOSIS — Z860101 Personal history of adenomatous and serrated colon polyps: Secondary | ICD-10-CM | POA: Diagnosis not present

## 2024-02-29 DIAGNOSIS — D120 Benign neoplasm of cecum: Secondary | ICD-10-CM | POA: Diagnosis not present

## 2024-02-29 DIAGNOSIS — Z09 Encounter for follow-up examination after completed treatment for conditions other than malignant neoplasm: Secondary | ICD-10-CM | POA: Diagnosis not present

## 2024-02-29 DIAGNOSIS — K5730 Diverticulosis of large intestine without perforation or abscess without bleeding: Secondary | ICD-10-CM | POA: Diagnosis not present

## 2024-03-02 ENCOUNTER — Other Ambulatory Visit: Payer: Self-pay | Admitting: Internal Medicine

## 2024-03-03 NOTE — Progress Notes (Signed)
 Patient called asking about eating or drinking after MN tonight since her surgery is tomorrow. Educated patient about nothing by mouth after MN but could take medication with sips of water before coming tomorrow. Patient verbalized understanding of these instructions.

## 2024-03-03 NOTE — H&P (Signed)
 53 y.o.  complains of PMP bleeding.  Previously:  12-10-23: "GYN scan today, for AUB. see pt case from 02/24; pt reports intermittent bleeding started on 11/21/23 with cramping. lasting x 1 week -SH//  Physician interpretation: 9x5x8; Em 8mm, no increased blood flow. Normal LO, RO not seen. Hyperechoic mass LUS 3 cm like fibroid.  Bleeding has continued since with cramping like when on cycle; went off two weeks ago. Previously no period for a year, then two back to back, last one lasted a whole week. "  Then returned for pain in May: "uncharacteristically tender lower abdomen and cervical tenderness with movement. no masses. RTC Thur f/u but gc/ct sent, will do doxycycline  in case of uterine infection and US  repeat on Thur.  Call if fever >=100.4."  Treated with doxy and pain subsided, likely was degenerating fibroid.  "Feels much better, on ibuprofen and helping. Likely degenerating fibroid:  Did preop.  Physician interpretation: 10x5; Em 7.45mm. Complex hyperechoic lesion lus about 3 cm to right of uterus. Does not appear to be with endo. Normal LO, RO not seen.   Echoic mass is about the same as 2021. "  Past Medical History:  Diagnosis Date   Anxiety    Diabetes mellitus (HCC) 07/08/2018   Diabetes mellitus without complication (HCC)    Hypertension    MS (multiple sclerosis) (HCC)    Past Surgical History:  Procedure Laterality Date   APPENDECTOMY     CESAREAN SECTION      Social History   Socioeconomic History   Marital status: Single    Spouse name: Not on file   Number of children: 1   Years of education: 14   Highest education level: Not on file  Occupational History   Occupation: Manufacturing engineer  Tobacco Use   Smoking status: Never   Smokeless tobacco: Never  Vaping Use   Vaping status: Never Used  Substance and Sexual Activity   Alcohol use: No   Drug use: No   Sexual activity: Not on file  Other Topics Concern   Not on file  Social History  Narrative   Patient lives with mom in a one story home.  Has one daughter.  Works as an Manufacturing engineer.  Education: BS   Right handed   Social Drivers of Corporate investment banker Strain: Not on file  Food Insecurity: Not on file  Transportation Needs: Not on file  Physical Activity: Not on file  Stress: Not on file  Social Connections: Not on file  Intimate Partner Violence: Not on file    Current Facility-Administered Medications on File Prior to Encounter  Medication Dose Route Frequency Provider Last Rate Last Admin   technetium tetrofosmin  (TC-MYOVIEW ) injection 31.7 millicurie  31.7 millicurie Intravenous Once PRN Nahser, Lela Purple, MD       Current Outpatient Medications on File Prior to Encounter  Medication Sig Dispense Refill   ALPRAZolam (XANAX) 0.25 MG tablet Take 0.25 mg by mouth 2 (two) times daily as needed.  2   losartan  (COZAAR ) 25 MG tablet Take 1 tablet (25 mg total) by mouth daily. 90 tablet 2   metFORMIN (GLUCOPHAGE) 500 MG tablet Take 500 mg by mouth 2 (two) times daily with a meal.     norethindrone-ethinyl estradiol-FE (LOESTRIN FE) 1-20 MG-MCG tablet Take 1 tablet by mouth daily.     rosuvastatin (CRESTOR) 10 MG tablet Take 10 mg by mouth daily.     fluticasone (FLONASE) 50 MCG/ACT nasal spray fluticasone propionate  50 mcg/actuation nasal spray,suspension      Allergies  Allergen Reactions   Amoxicillin Hives and Itching   Hydrocodone-Acetaminophen Itching   Vicodin [Hydrocodone-Acetaminophen] Itching    Vitals:   02/25/24 1208  Weight: 127 kg  Height: 5' 4.5" (1.638 m)    Lungs: clear to ascultation Cor:  RRR Abdomen:  soft, nontender, nondistended. Ex:  no cords, erythema Pelvic:   At time of pain: Vulva: no masses, no atrophy, no lesions Vagina: no tenderness, no erythema, no abnormal vaginal discharge, no vesicle(s) or ulcers, no cystocele, no rectocele Cervix: no discharge, cervical motion tenderness (some) Uterus: normal size,  normal shape, midline, no uterine prolapse, mobile, tender Bladder/Urethra: normal meatus, no urethral discharge, no urethral mass, bladder non distended, Urethra well supported Adnexa/Parametria: no parametrial tenderness, no parametrial mass, no adnexal tenderness, no ovarian mass  A:  Pain resolved and US  did not change.  Pt did do antibiotics in case but I think pain was instead degenerating fibroid.  Will proceed with D&C, hysteroscopy for PMP bleeding.   P: P: All risks, benefits and alternatives d/w patient and she desires to proceed.  Patient has undergone an ERAS protocol and will receive SCDs during the operation.    Oddis Bench

## 2024-03-04 ENCOUNTER — Encounter (HOSPITAL_COMMUNITY): Payer: Self-pay | Admitting: Obstetrics and Gynecology

## 2024-03-04 ENCOUNTER — Ambulatory Visit (HOSPITAL_COMMUNITY): Admitting: Anesthesiology

## 2024-03-04 ENCOUNTER — Other Ambulatory Visit: Payer: Self-pay

## 2024-03-04 ENCOUNTER — Encounter (HOSPITAL_COMMUNITY)
Admission: RE | Disposition: A | Discharge status: Home / Self Care | Payer: Self-pay | Source: Home / Self Care | Attending: Obstetrics and Gynecology

## 2024-03-04 ENCOUNTER — Ambulatory Visit (HOSPITAL_COMMUNITY)
Admission: RE | Admit: 2024-03-04 | Discharge: 2024-03-04 | Disposition: A | Discharge status: Home / Self Care | Attending: Obstetrics and Gynecology | Admitting: Obstetrics and Gynecology

## 2024-03-04 DIAGNOSIS — Z7984 Long term (current) use of oral hypoglycemic drugs: Secondary | ICD-10-CM | POA: Diagnosis not present

## 2024-03-04 DIAGNOSIS — D259 Leiomyoma of uterus, unspecified: Secondary | ICD-10-CM | POA: Insufficient documentation

## 2024-03-04 DIAGNOSIS — E119 Type 2 diabetes mellitus without complications: Secondary | ICD-10-CM | POA: Diagnosis not present

## 2024-03-04 DIAGNOSIS — F419 Anxiety disorder, unspecified: Secondary | ICD-10-CM | POA: Diagnosis not present

## 2024-03-04 DIAGNOSIS — Z01818 Encounter for other preprocedural examination: Secondary | ICD-10-CM

## 2024-03-04 DIAGNOSIS — N950 Postmenopausal bleeding: Secondary | ICD-10-CM | POA: Diagnosis not present

## 2024-03-04 DIAGNOSIS — E66813 Obesity, class 3: Secondary | ICD-10-CM | POA: Diagnosis not present

## 2024-03-04 DIAGNOSIS — G35 Multiple sclerosis: Secondary | ICD-10-CM | POA: Insufficient documentation

## 2024-03-04 DIAGNOSIS — I10 Essential (primary) hypertension: Secondary | ICD-10-CM | POA: Diagnosis not present

## 2024-03-04 DIAGNOSIS — Z79899 Other long term (current) drug therapy: Secondary | ICD-10-CM | POA: Diagnosis not present

## 2024-03-04 DIAGNOSIS — Z6841 Body Mass Index (BMI) 40.0 and over, adult: Secondary | ICD-10-CM | POA: Diagnosis not present

## 2024-03-04 DIAGNOSIS — Z793 Long term (current) use of hormonal contraceptives: Secondary | ICD-10-CM | POA: Diagnosis not present

## 2024-03-04 HISTORY — DX: Essential (primary) hypertension: I10

## 2024-03-04 HISTORY — PX: HYSTEROSCOPY WITH D & C: SHX1775

## 2024-03-04 HISTORY — DX: Other specified health status: Z78.9

## 2024-03-04 LAB — BASIC METABOLIC PANEL WITH GFR
Anion gap: 9 (ref 5–15)
BUN: 12 mg/dL (ref 6–20)
CO2: 25 mmol/L (ref 22–32)
Calcium: 8.3 mg/dL — ABNORMAL LOW (ref 8.9–10.3)
Chloride: 105 mmol/L (ref 98–111)
Creatinine, Ser: 0.98 mg/dL (ref 0.44–1.00)
GFR, Estimated: >60 mL/min (ref >60)
Glucose, Bld: 117 mg/dL — ABNORMAL HIGH (ref 70–99)
Potassium: 4.2 mmol/L (ref 3.5–5.1)
Sodium: 139 mmol/L (ref 135–145)

## 2024-03-04 LAB — GLUCOSE, CAPILLARY
Glucose-Capillary: 118 mg/dL — ABNORMAL HIGH (ref 70–99)
Glucose-Capillary: 114 mg/dL — ABNORMAL HIGH (ref 70–99)

## 2024-03-04 LAB — CBC
HCT: 37.8 % (ref 36.0–46.0)
Hemoglobin: 12.4 g/dL (ref 12.0–15.0)
MCH: 27.9 pg (ref 26.0–34.0)
MCHC: 32.8 g/dL (ref 30.0–36.0)
MCV: 84.9 fL (ref 80.0–100.0)
Platelets: 185 10*3/uL (ref 150–400)
RBC: 4.45 MIL/uL (ref 3.87–5.11)
RDW: 13.3 % (ref 11.5–15.5)
WBC: 5.4 10*3/uL (ref 4.0–10.5)
nRBC: 0 % (ref 0.0–0.2)

## 2024-03-04 LAB — HEMOGLOBIN A1C
Hgb A1c MFr Bld: 6.5 % — ABNORMAL HIGH (ref 4.8–5.6)
Mean Plasma Glucose: 139.85 mg/dL

## 2024-03-04 SURGERY — DILATATION AND CURETTAGE /HYSTEROSCOPY
Anesthesia: General | Site: Uterus

## 2024-03-04 MED ORDER — KETOROLAC TROMETHAMINE 30 MG/ML IJ SOLN
INTRAMUSCULAR | Status: AC
  Filled 2024-03-04: qty 1

## 2024-03-04 MED ORDER — LIDOCAINE 2% (20 MG/ML) 5 ML SYRINGE
INTRAMUSCULAR | Status: DC | PRN
  Administered 2024-03-04: 60 mg via INTRAVENOUS

## 2024-03-04 MED ORDER — POVIDONE-IODINE 10 % EX SWAB: 2.0000 | Freq: Once | CUTANEOUS | Status: DC

## 2024-03-04 MED ORDER — SODIUM CHLORIDE 0.9 % IR SOLN
Status: DC | PRN
  Administered 2024-03-04: 3000 mL

## 2024-03-04 MED ORDER — AMISULPRIDE (ANTIEMETIC) 5 MG/2ML IV SOLN: 10.0000 mg | Freq: Once | INTRAVENOUS | Status: DC | PRN

## 2024-03-04 MED ORDER — EPHEDRINE SULFATE-NACL 50-0.9 MG/10ML-% IV SOSY
PREFILLED_SYRINGE | INTRAVENOUS | Status: DC | PRN
  Administered 2024-03-04: 10 mg via INTRAVENOUS

## 2024-03-04 MED ORDER — LACTATED RINGERS IV SOLN: INTRAVENOUS | Status: DC

## 2024-03-04 MED ORDER — SOD CITRATE-CITRIC ACID 500-334 MG/5ML PO SOLN: 30.0000 mL | ORAL | Status: DC

## 2024-03-04 MED ORDER — ORAL CARE MOUTH RINSE: 15.0000 mL | Freq: Once | OROMUCOSAL | Status: AC

## 2024-03-04 MED ORDER — OXYCODONE HCL 5 MG/5ML PO SOLN: 5.0000 mg | Freq: Once | ORAL | Status: DC | PRN

## 2024-03-04 MED ORDER — OXYCODONE HCL 5 MG PO TABS: 5.0000 mg | ORAL_TABLET | Freq: Once | ORAL | Status: DC | PRN

## 2024-03-04 MED ORDER — ACETAMINOPHEN 500 MG PO TABS: 1000.0000 mg | ORAL_TABLET | Freq: Once | ORAL | Status: AC

## 2024-03-04 MED ORDER — KETOROLAC TROMETHAMINE 30 MG/ML IJ SOLN
30.0000 mg | Freq: Once | INTRAMUSCULAR | Status: AC | PRN
  Administered 2024-03-04: 30 mg via INTRAVENOUS

## 2024-03-04 MED ORDER — PROPOFOL 10 MG/ML IV BOLUS
INTRAVENOUS | Status: AC
  Filled 2024-03-04: qty 20

## 2024-03-04 MED ORDER — PROPOFOL 10 MG/ML IV BOLUS
INTRAVENOUS | Status: DC | PRN
  Administered 2024-03-04: 200 mg via INTRAVENOUS
  Administered 2024-03-04: 120 mg via INTRAVENOUS

## 2024-03-04 MED ORDER — FENTANYL CITRATE (PF) 250 MCG/5ML IJ SOLN
INTRAMUSCULAR | Status: AC
  Filled 2024-03-04: qty 5

## 2024-03-04 MED ORDER — MIDAZOLAM HCL 2 MG/2ML IJ SOLN
INTRAMUSCULAR | Status: DC | PRN
  Administered 2024-03-04: 2 mg via INTRAVENOUS

## 2024-03-04 MED ORDER — MIDAZOLAM HCL 2 MG/2ML IJ SOLN
INTRAMUSCULAR | Status: AC
  Filled 2024-03-04: qty 2

## 2024-03-04 MED ORDER — CHLORHEXIDINE GLUCONATE 0.12 % MT SOLN: 15.0000 mL | Freq: Once | OROMUCOSAL | Status: AC

## 2024-03-04 MED ORDER — FENTANYL CITRATE (PF) 250 MCG/5ML IJ SOLN
INTRAMUSCULAR | Status: DC | PRN
  Administered 2024-03-04: 100 ug via INTRAVENOUS
  Administered 2024-03-04: 50 ug via INTRAVENOUS

## 2024-03-04 MED ORDER — INSULIN ASPART 100 UNIT/ML IJ SOLN: 0.0000 [IU] | INTRAMUSCULAR | Status: DC | PRN

## 2024-03-04 MED ORDER — ACETAMINOPHEN 500 MG PO TABS
ORAL_TABLET | ORAL | Status: AC
  Administered 2024-03-04: 1000 mg via ORAL
  Filled 2024-03-04: qty 2

## 2024-03-04 MED ORDER — ONDANSETRON HCL 4 MG/2ML IJ SOLN: 4.0000 mg | Freq: Once | INTRAMUSCULAR | Status: DC | PRN

## 2024-03-04 MED ORDER — HYDROMORPHONE HCL 1 MG/ML IJ SOLN: 0.2500 mg | INTRAMUSCULAR | Status: DC | PRN

## 2024-03-04 MED ORDER — ONDANSETRON HCL 4 MG/2ML IJ SOLN
INTRAMUSCULAR | Status: DC | PRN
  Administered 2024-03-04: 4 mg via INTRAVENOUS

## 2024-03-04 MED ORDER — CHLORHEXIDINE GLUCONATE 0.12 % MT SOLN
OROMUCOSAL | Status: AC
  Administered 2024-03-04: 15 mL via OROMUCOSAL
  Filled 2024-03-04: qty 15

## 2024-03-04 MED ORDER — DEXAMETHASONE SODIUM PHOSPHATE 10 MG/ML IJ SOLN
INTRAMUSCULAR | Status: DC | PRN
  Administered 2024-03-04: 5 mg via INTRAVENOUS

## 2024-03-04 SURGICAL SUPPLY — 16 items
CATH ROBINSON RED A/P 16FR (CATHETERS) ×2 IMPLANT
DEVICE MYOSURE LITE (MISCELLANEOUS) IMPLANT
DEVICE MYOSURE REACH (MISCELLANEOUS) IMPLANT
DILATOR CANAL MILEX (MISCELLANEOUS) IMPLANT
GAUZE 4X4 16PLY ~~LOC~~+RFID DBL (SPONGE) IMPLANT
GLOVE BIO SURGEON STRL SZ7 (GLOVE) ×2 IMPLANT
GOWN STRL REUS W/ TWL XL LVL3 (GOWN DISPOSABLE) ×2 IMPLANT
KIT PROCEDURE FLUENT (KITS) ×2 IMPLANT
KIT TURNOVER KIT B (KITS) ×2 IMPLANT
PACK VAGINAL MINOR WOMEN LF (CUSTOM PROCEDURE TRAY) ×2 IMPLANT
PAD OB MATERNITY 11 LF (PERSONAL CARE ITEMS) ×2 IMPLANT
SEAL CERVICAL OMNI LOK (ABLATOR) IMPLANT
SEAL ROD LENS SCOPE MYOSURE (ABLATOR) ×2 IMPLANT
SLEEVE SCD COMPRESS KNEE MED (STOCKING) ×2 IMPLANT
SYSTEM TISS REMOVAL MYOSURE XL (MISCELLANEOUS) IMPLANT
TOWEL GREEN STERILE (TOWEL DISPOSABLE) ×2 IMPLANT

## 2024-03-04 NOTE — Discharge Instructions (Signed)

## 2024-03-04 NOTE — Brief Op Note (Signed)
 03/04/2024  8:32 AM  PATIENT:  Ann [name]  53 y.o. female  PRE-OPERATIVE DIAGNOSIS:  POST MENOPAUSAL BLEEDING  POST-OPERATIVE DIAGNOSIS:  POST MENOPAUSAL BLEEDING  PROCEDURE:  Procedure(s): DILATATION AND CURETTAGE /HYSTEROSCOPY (N/A)  SURGEON:  Surgeons and Role:    * Matt Song, MD - Primary  ANESTHESIA:   general  EBL:  5 mL   LOCAL MEDICATIONS USED:  NONE  SPECIMEN:  Source of Specimen:  uterine curettings   DISPOSITION OF SPECIMEN:  PATHOLOGY  COUNTS:  YES  TOURNIQUET:  * No tourniquets in log *  DICTATION: .Note written in EPIC  PLAN OF CARE: Discharge to home after PACU  PATIENT DISPOSITION:  PACU - hemodynamically stable.   Delay start of Pharmacological VTE agent (>24hrs) due to surgical blood loss or risk of bleeding: not applicable

## 2024-03-04 NOTE — Anesthesia Procedure Notes (Signed)
 Procedure Name: LMA Insertion Date/Time: 03/04/2024 8:07 AM  Performed by: Hershall Lory, CRNAPre-anesthesia Checklist: Patient identified, Emergency Drugs available, Suction available and Patient being monitored Patient Re-evaluated:Patient Re-evaluated prior to induction Oxygen Delivery Method: Circle System Utilized Preoxygenation: Pre-oxygenation with 100% oxygen Induction Type: IV induction Ventilation: Mask ventilation without difficulty LMA: LMA inserted LMA Size: 4.0 Number of attempts: 1 Airway Equipment and Method: Bite block Placement Confirmation: positive ETCO2 Tube secured with: Tape Dental Injury: Teeth and Oropharynx as per pre-operative assessment

## 2024-03-04 NOTE — H&P (Signed)
 53 y.o.  complains of PMP bleeding.   Previously:   12-10-23: "GYN scan today, for AUB. see pt case from 02/24; pt reports intermittent bleeding started on 11/21/23 with cramping. lasting x 1 week -SH//   Physician interpretation: 9x5x8; Em 8mm, no increased blood flow. Normal LO, RO not seen. Hyperechoic mass LUS 3 cm like fibroid.   Bleeding has continued since with cramping like when on cycle; went off two weeks ago. Previously no period for a year, then two back to back, last one lasted a whole week. "   Then returned for pain in May: "uncharacteristically tender lower abdomen and cervical tenderness with movement. no masses. RTC Thur f/u but gc/ct sent, will do doxycycline  in case of uterine infection and US  repeat on Thur.  Call if fever >=100.4."   Treated with doxy and pain subsided, likely was degenerating fibroid.   "Feels much better, on ibuprofen and helping. Likely degenerating fibroid:  Did preop.   Physician interpretation: 10x5; Em 7.69mm. Complex hyperechoic lesion lus about 3 cm to right of uterus. Does not appear to be with endo. Normal LO, RO not seen.    Echoic mass is about the same as 2021. "       Past Medical History:  Diagnosis Date   Anxiety     Diabetes mellitus (HCC) 07/08/2018   Diabetes mellitus without complication (HCC)     Hypertension     MS (multiple sclerosis) (HCC)               Past Surgical History:  Procedure Laterality Date   APPENDECTOMY       CESAREAN SECTION            Social History         Socioeconomic History   Marital status: Single      Spouse name: Not on file   Number of children: 1   Years of education: 14   Highest education level: Not on file  Occupational History   Occupation: Manufacturing engineer  Tobacco Use   Smoking status: Never   Smokeless tobacco: Never  Vaping Use   Vaping status: Never Used  Substance and Sexual Activity   Alcohol use: No   Drug use: No   Sexual activity: Not on file   Other Topics Concern   Not on file  Social History Narrative    Patient lives with mom in a one story home.  Has one daughter.  Works as an Manufacturing engineer.  Education: BS    Right handed    Social Drivers of Manufacturing engineer Strain: Not on file  Food Insecurity: Not on file  Transportation Needs: Not on file  Physical Activity: Not on file  Stress: Not on file  Social Connections: Not on file  Intimate Partner Violence: Not on file      Medications Ordered Prior to Encounter           Current Facility-Administered Medications on File Prior to Encounter  Medication Dose Route Frequency Provider Last Rate Last Admin   technetium tetrofosmin  (TC-MYOVIEW ) injection 31.7 millicurie  31.7 millicurie Intravenous Once PRN Nahser, Lela Purple, MD              Current Outpatient Medications on File Prior to Encounter  Medication Sig Dispense Refill   ALPRAZolam (XANAX) 0.25 MG tablet Take 0.25 mg by mouth 2 (two) times daily as needed.   2   losartan  (COZAAR ) 25 MG tablet Take 1  tablet (25 mg total) by mouth daily. 90 tablet 2   metFORMIN (GLUCOPHAGE) 500 MG tablet Take 500 mg by mouth 2 (two) times daily with a meal.       norethindrone-ethinyl estradiol-FE (LOESTRIN FE) 1-20 MG-MCG tablet Take 1 tablet by mouth daily.       rosuvastatin (CRESTOR) 10 MG tablet Take 10 mg by mouth daily.       fluticasone (FLONASE) 50 MCG/ACT nasal spray fluticasone propionate 50 mcg/actuation nasal spray,suspension            Allergies      Allergies  Allergen Reactions   Amoxicillin Hives and Itching   Hydrocodone-Acetaminophen Itching   Vicodin [Hydrocodone-Acetaminophen] Itching           Vitals:    02/25/24 1208  Weight: 127 kg  Height: 5' 4.5" (1.638 m)      Lungs: clear to ascultation Cor:  RRR Abdomen:  soft, nontender, nondistended. Ex:  no cords, erythema Pelvic:   At time of pain: Vulva: no masses, no atrophy, no lesions Vagina: no tenderness, no erythema,  no abnormal vaginal discharge, no vesicle(s) or ulcers, no cystocele, no rectocele Cervix: no discharge, cervical motion tenderness (some) Uterus: normal size, normal shape, midline, no uterine prolapse, mobile, tender Bladder/Urethra: normal meatus, no urethral discharge, no urethral mass, bladder non distended, Urethra well supported Adnexa/Parametria: no parametrial tenderness, no parametrial mass, no adnexal tenderness, no ovarian mass   A:  Pain resolved and US  did not change.  Pt did do antibiotics in case but I think pain was instead degenerating fibroid.  Will proceed with D&C, hysteroscopy for PMP bleeding.     P: P: All risks, benefits and alternatives d/w patient and she desires to proceed.  Patient has undergone an ERAS protocol and will receive SCDs during the operation.    Oddis Bench        Deleted by: Matt Song, MD at 03/04/2024  7:26 AM

## 2024-03-04 NOTE — Op Note (Signed)
 03/04/2024  8:32 AM  PATIENT:  Ann [name]  53 y.o. female  PRE-OPERATIVE DIAGNOSIS:  POST MENOPAUSAL BLEEDING  POST-OPERATIVE DIAGNOSIS:  POST MENOPAUSAL BLEEDING  PROCEDURE:  Procedure(s): DILATATION AND CURETTAGE /HYSTEROSCOPY (N/A)  SURGEON:  Surgeons and Role:    * Matt Song, MD - Primary  ANESTHESIA:   general  EBL:  5 mL   LOCAL MEDICATIONS USED:  NONE  SPECIMEN:  Source of Specimen:  uterine curettings   DISPOSITION OF SPECIMEN:  PATHOLOGY  COUNTS:  YES  TOURNIQUET:  * No tourniquets in log *  DICTATION: .Note written in EPIC  PLAN OF CARE: Discharge to home after PACU  PATIENT DISPOSITION:  PACU - hemodynamically stable.   Delay start of Pharmacological VTE agent (>24hrs) due to surgical blood loss or risk of bleeding: not applicable  Complications: none  Medications: none  Findings: normal endometrial cavity  Technique:  After adequate anesthesia was achieved, the patient was prepped and draped in the usual sterile fashion.  The speculum was placed in the vagina and the cervix stabilized with a single-tooth tenaculum.  The cervix was dilated with hagar and pratt dilators and the hysteroscope passed inside the endometrial cavity.  The above findings were noted and polyp forceps were used to remove small pieces of tissue.  Sharp curettage was then performed and uterine curettings sent to path.  All instruments were removed from the vagina.  The patient tolerated the procedure well.    Danell Vazquez A

## 2024-03-04 NOTE — Progress Notes (Signed)
 There has been no change in the patients history, status or exam since the history and physical.  Vitals:   02/25/24 1208 03/04/24 0558 03/04/24 0626  BP:  135/89   Pulse:  84   Resp:  16   Temp:  98.1 F (36.7 C)   TempSrc:  Oral   SpO2:  96%   Weight: 127 kg  127 kg  Height: 5' 4.5" (1.638 m) 5' 4.5" (1.638 m)    Labs will be drawn during case by anesthesia for hard stick. No results found for this or any previous visit (from the past 72 hours).  Oddis Bench

## 2024-03-04 NOTE — Interval H&P Note (Signed)
 History and Physical Interval Note:  03/04/2024 7:25 AM  Ann [name]  has presented today for surgery, with the diagnosis of POST MENOPAUSAL BLEEDING.  The various methods of treatment have been discussed with the patient and family. After consideration of risks, benefits and other options for treatment, the patient has consented to  Procedure(s): DILATATION AND CURETTAGE /HYSTEROSCOPY (N/A) as a surgical intervention.  The patient's history has been reviewed, patient examined, no change in status, stable for surgery.  I have reviewed the patient's chart and labs.  Questions were answered to the patient's satisfaction.     Oddis Bench

## 2024-03-04 NOTE — Discharge Instr - Supplementary Instructions (Addendum)
 May have some vaginal spotting for up to 2 weeks.  Call MD for heavy vaginal bleeding.  Take short walks today around the house every 1-2 hours while awake. Take Tylenol after 12:30pm if needed for discomfort.  Take Ibuprofen after 3pm if needed for discomfort.

## 2024-03-04 NOTE — Transfer of Care (Signed)
 Immediate Anesthesia Transfer of Care Note  Patient: Rabia Argote  Procedure(s) Performed: DILATATION AND CURETTAGE /HYSTEROSCOPY (Uterus)  Patient Location: PACU  Anesthesia Type:General  Level of Consciousness: awake and alert   Airway & Oxygen Therapy: Patient Spontanous Breathing  Post-op Assessment: Report given to RN  Post vital signs: Reviewed and stable  Last Vitals:  Vitals Value Taken Time  BP 91/51 03/04/24 0843  Temp 36.8 C 03/04/24 0843  Pulse 69 03/04/24 0844  Resp 11 03/04/24 0844  SpO2 100 % 03/04/24 0844  Vitals shown include unfiled device data.  Last Pain:  Vitals:   03/04/24 0558  TempSrc: Oral  PainSc: 0-No pain      Patients Stated Pain Goal: 3 (03/04/24 0558)  Complications: No notable events documented.

## 2024-03-04 NOTE — Anesthesia Postprocedure Evaluation (Signed)
 Anesthesia Post Note  Patient: Roderick Sweezy  Procedure(s) Performed: DILATATION AND CURETTAGE /HYSTEROSCOPY (Uterus)     Patient location during evaluation: PACU Anesthesia Type: General Level of consciousness: awake and alert, oriented and patient cooperative Pain management: pain level controlled Vital Signs Assessment: post-procedure vital signs reviewed and stable Respiratory status: spontaneous breathing, nonlabored ventilation and respiratory function stable Cardiovascular status: blood pressure returned to baseline and stable Postop Assessment: no apparent nausea or vomiting Anesthetic complications: yes Comments: Very difficult PIV placement   No notable events documented.  Last Vitals:  Vitals:   03/04/24 0843 03/04/24 0909  BP: (!) 91/51   Pulse: 71 68  Resp: 12 16  Temp: 36.8 C   SpO2: 100% 100%    Last Pain:  Vitals:   03/04/24 0903  TempSrc:   PainSc: 4                  Jacquelyne Matte

## 2024-03-04 NOTE — H&P (View-Only) (Signed)
 There has been no change in the patients history, status or exam since the history and physical.  Vitals:   02/25/24 1208 03/04/24 0558 03/04/24 0626  BP:  135/89   Pulse:  84   Resp:  16   Temp:  98.1 F (36.7 C)   TempSrc:  Oral   SpO2:  96%   Weight: 127 kg  127 kg  Height: 5' 4.5" (1.638 m) 5' 4.5" (1.638 m)    Labs will be drawn during case by anesthesia for hard stick. No results found for this or any previous visit (from the past 72 hours).  Oddis Bench

## 2024-03-04 NOTE — Anesthesia Preprocedure Evaluation (Addendum)
 Anesthesia Evaluation  Patient identified by MRN, date of birth, ID band Patient awake    Reviewed: Allergy & Precautions, H&P , NPO status , Patient's Chart, lab work & pertinent test results  Airway Mallampati: I  TM Distance: >3 FB Neck ROM: Full    Dental  (+) Teeth Intact, Dental Advisory Given   Pulmonary neg pulmonary ROS   Pulmonary exam normal breath sounds clear to auscultation       Cardiovascular hypertension (135/89 preop), Pt. on medications Normal cardiovascular exam Rhythm:Regular Rate:Normal     Neuro/Psych  PSYCHIATRIC DISORDERS Anxiety     negative neurological ROS     GI/Hepatic negative GI ROS, Neg liver ROS,,,  Endo/Other  diabetes, Well Controlled, Type 2, Oral Hypoglycemic Agents  Class 3 obesity (BMI 47)  Renal/GU negative Renal ROS  negative genitourinary   Musculoskeletal negative musculoskeletal ROS (+)    Abdominal  (+) + obese  Peds negative pediatric ROS (+)  Hematology negative hematology ROS (+)   Anesthesia Other Findings   Reproductive/Obstetrics negative OB ROS                             Anesthesia Physical Anesthesia Plan  ASA: 3  Anesthesia Plan: General   Post-op Pain Management: Tylenol PO (pre-op)* and Toradol IV (intra-op)*   Induction: Intravenous  PONV Risk Score and Plan: Ondansetron , Dexamethasone , Midazolam and Treatment may vary due to age or medical condition  Airway Management Planned: LMA  Additional Equipment: None  Intra-op Plan:   Post-operative Plan: Extubation in OR  Informed Consent: I have reviewed the patients History and Physical, chart, labs and discussed the procedure including the risks, benefits and alternatives for the proposed anesthesia with the patient or authorized representative who has indicated his/her understanding and acceptance.     Dental advisory given  Plan Discussed with: CRNA  Anesthesia  Plan Comments:        Anesthesia Quick Evaluation

## 2024-03-05 ENCOUNTER — Encounter (HOSPITAL_COMMUNITY): Payer: Self-pay | Admitting: Obstetrics and Gynecology

## 2024-03-07 ENCOUNTER — Telehealth: Payer: Self-pay | Admitting: Internal Medicine

## 2024-03-07 LAB — SURGICAL PATHOLOGY

## 2024-03-07 MED ORDER — LOSARTAN POTASSIUM 25 MG PO TABS: 25.0000 mg | ORAL_TABLET | Freq: Every day | ORAL | 0 refills | Status: DC

## 2024-03-07 NOTE — Telephone Encounter (Signed)
 Pt's medication was sent to pt's pharmacy as requested. Confirmation received.

## 2024-03-07 NOTE — Telephone Encounter (Signed)
 *  STAT* If patient is at the pharmacy, call can be transferred to refill team.   1. Which medications need to be refilled? (please list name of each medication and dose if known) losartan  (COZAAR ) 25 MG tablet    2. Would you like to learn more about the convenience, safety, & potential cost savings by using the [facility]? No      3. Are you open to using the [facility] (Type [facility]. ). No    4. Which pharmacy/location (including street and city if local pharmacy) is medication to be sent to? [id number] - [place], [place] - [address]     5. Do they need a 30 day or 90 day supply? 90 fays   Pt made an appt with Reesa Cannon on 07/28 at 9:15 am. Also pt is out of meds need refill today

## 2024-03-25 DIAGNOSIS — H5213 Myopia, bilateral: Secondary | ICD-10-CM | POA: Diagnosis not present

## 2024-03-25 DIAGNOSIS — E119 Type 2 diabetes mellitus without complications: Secondary | ICD-10-CM | POA: Diagnosis not present

## 2024-04-06 DIAGNOSIS — N921 Excessive and frequent menstruation with irregular cycle: Secondary | ICD-10-CM | POA: Diagnosis not present

## 2024-04-11 NOTE — Progress Notes (Signed)
  Cardiology Office Note:  .   Date:  04/25/2024  ID:  Ann [name], DOB 1971-05-17, MRN [id number] PCP: Ann Lamarr RAMAN, MD  Ann [name] Providers Cardiologist:  Soyla DELENA Merck, MD    History of Present Illness: .   Ann [name] is a 53 y.o. female   with a hx of  DM, relapsing remitting MS, HTN, bifascicular block, and chronic HFpEF.  2019 ED visit revealed negative troponin, EKG with no changes concerning for MI but with bifascicular block with right bundle branch block and left anterior fascicular block, unchanged from 2017. Echo revealed LVEF 60-65%, mild LVH, no rwma, G1DD, trival PR, trivial TR.  Cardiac monitor revealed no evidence of AV block, no evidence of progressive conduction system block beyond known bifascicular block. Gated exercise myoview  revealed no ST segment deviation noted during stress, no evidence of ischemia or previous infarction, low risk study. Additional cardiac monitoring was ordered due to symptom of dizziness during initial 24 hr monitor but the one week monitor did not reveal any further pauses or conduction disease and patient had no dizziness. We are avoiding AV nodal blocking agents in the setting of bifascicular block.    Patient denies chest pain, palpitations, dyspnea, edema, palpitations. No regular exercise. A1C 6.7. PCP starting her on ozempic.     ROS:    Studies Reviewed: SABRA    EKG Interpretation Date/Time:  Monday April 25 2024 09:37:35 EDT Ventricular Rate:  91 PR Interval:  130 QRS Duration:  128 QT Interval:  416 QTC Calculation: 511 R Axis:   -59  Text Interpretation: Normal sinus rhythm Possible Left atrial enlargement Right bundle branch block Left anterior fascicular block Bifascicular block When compared with ECG of 24-Dec-2019 02:42, PREVIOUS ECG IS PRESENT Confirmed by [name] Ann [phone]) on 04/25/2024 9:40:20 AM    Prior CV Studies:      Risk Assessment/Calculations:             Physical Exam:   VS:   BP 130/70 (BP Location: Left Arm, Patient Position: Sitting, Cuff Size: Normal)   Pulse 91   Ht 5' 4.5 (1.638 m)   Wt 281 lb (127.5 kg)   BMI 47.49 kg/m    Orhtostatics: No data found. Wt Readings from Last 3 Encounters:  04/25/24 281 lb (127.5 kg)  03/04/24 279 lb 15.8 oz (127 kg)  12/31/22 283 lb (128.4 kg)    GEN: Obese, in no acute distress NECK: No JVD; No carotid bruits CARDIAC:  RRR, no murmurs, rubs, gallops RESPIRATORY:  Clear to auscultation without rales, wheezing or rhonchi  ABDOMEN: Soft, non-tender, non-distended EXTREMITIES:  No edema; No deformity   ASSESSMENT AND PLAN: .    Bifascicular block, asymptomatic -Avoid AV nodal blocking agents   Hypertension, well controlled -BP at home 120s over 70s.  Continue losartan  25 mg daily. -Goal BP is <130/80.  -low salt diet   Hyperlipidemia with goal LDL less than 100 -LDL 66 10/7972 -Continue Crestor.    Morbid obesity with BMI 48 -Recommend healthy cardiac diet and regular activity. -to start ozempic once it comes in. 150 min exercise weekly  DM2 on metformin A1C 6.7        Dispo: f/u in 1 yr.  Signed, Ann Parthenia, PA-C

## 2024-04-12 DIAGNOSIS — R030 Elevated blood-pressure reading, without diagnosis of hypertension: Secondary | ICD-10-CM | POA: Diagnosis not present

## 2024-04-13 DIAGNOSIS — E119 Type 2 diabetes mellitus without complications: Secondary | ICD-10-CM | POA: Diagnosis not present

## 2024-04-13 DIAGNOSIS — M25819 Other specified joint disorders, unspecified shoulder: Secondary | ICD-10-CM | POA: Diagnosis not present

## 2024-04-18 DIAGNOSIS — M7502 Adhesive capsulitis of left shoulder: Secondary | ICD-10-CM | POA: Diagnosis not present

## 2024-04-18 DIAGNOSIS — M67912 Unspecified disorder of synovium and tendon, left shoulder: Secondary | ICD-10-CM | POA: Diagnosis not present

## 2024-04-25 ENCOUNTER — Ambulatory Visit: Attending: Physician Assistant | Admitting: Physician Assistant

## 2024-04-25 VITALS — BP 130/70 | HR 91 | Ht 64.5 in | Wt 281.0 lb

## 2024-04-25 DIAGNOSIS — I10 Essential (primary) hypertension: Secondary | ICD-10-CM

## 2024-04-25 DIAGNOSIS — I452 Bifascicular block: Secondary | ICD-10-CM | POA: Diagnosis not present

## 2024-04-25 DIAGNOSIS — E7849 Other hyperlipidemia: Secondary | ICD-10-CM | POA: Diagnosis not present

## 2024-04-25 DIAGNOSIS — E119 Type 2 diabetes mellitus without complications: Secondary | ICD-10-CM

## 2024-04-25 NOTE — Patient Instructions (Addendum)
 Medication Instructions:  Your physician recommends that you continue on your current medications as directed. Please refer to the Current Medication list given to you today.  *If you need a refill on your cardiac medications before your next appointment, please call your pharmacy*  Lab Work: NONE If you have labs (blood work) drawn today and your tests are completely normal, you will receive your results only by: MyChart Message (if you have MyChart) OR A paper copy in the mail If you have any lab test that is abnormal or we need to change your treatment, we will call you to review the results.  Testing/Procedures: NONE  Follow-Up: At [facility], you and your health needs are our priority.  As part of our continuing mission to provide you with exceptional heart care, our providers are all part of one team.  This team includes your primary Cardiologist (physician) and Advanced Practice Providers or APPs (Physician Assistants and Nurse Practitioners) who all work together to provide you with the care you need, when you need it.  Your next appointment:   1 YEAR  Provider:   Soyla DELENA Merck, MD   We recommend signing up for the patient portal called MyChart.  Sign up information is provided on this After Visit Summary.  MyChart is used to connect with patients for Virtual Visits (Telemedicine).  Patients are able to view lab/test results, encounter notes, upcoming appointments, etc.  Non-urgent messages can be sent to your provider as well.   To learn more about what you can do with MyChart, go to [email or web address].   Other Instructions Exercising to Stay Healthy To become healthy and stay healthy, it is recommended that you do moderate-intensity and vigorous-intensity exercise. You can tell that you are exercising at a moderate intensity if your heart starts beating faster and you start breathing faster but can still hold a conversation. You can tell that you are  exercising at a vigorous intensity if you are breathing much harder and faster and cannot hold a conversation while exercising. How can exercise benefit me? Exercising regularly is important. It has many health benefits, such as: Improving overall fitness, flexibility, and endurance. Increasing bone density. Helping with weight control. Decreasing body fat. Increasing muscle strength and endurance. Reducing stress and tension, anxiety, depression, or anger. Improving overall health. What guidelines should I follow while exercising? Before you start a new exercise program, talk with your health care provider. Do not exercise so much that you hurt yourself, feel dizzy, or get very short of breath. Wear comfortable clothes and wear shoes with good support. Drink plenty of water while you exercise to prevent dehydration or heat stroke. Work out until your breathing and your heartbeat get faster (moderate intensity). How often should I exercise? Choose an activity that you enjoy, and set realistic goals. Your health care provider can help you make an activity plan that is individually designed and works best for you. Exercise regularly as told by your health care provider. This may include: Doing strength training two times a week, such as: Lifting weights. Using resistance bands. Push-ups. Sit-ups. Yoga. Doing a certain intensity of exercise for a given amount of time. Choose from these options: A total of 150 minutes of moderate-intensity exercise every week. A total of 75 minutes of vigorous-intensity exercise every week. A mix of moderate-intensity and vigorous-intensity exercise every week. Children, pregnant women, people who have not exercised regularly, people who are overweight, and older adults may need to talk with a health  care provider about what activities are safe to perform. If you have a medical condition, be sure to talk with your health care provider before you start a new  exercise program. What are some exercise ideas? Moderate-intensity exercise ideas include: Walking 1 mile (1.6 km) in about 15 minutes. Biking. Hiking. Golfing. Dancing. Water aerobics. Vigorous-intensity exercise ideas include: Walking 4.5 miles (7.2 km) or more in about 1 hour. Jogging or running 5 miles (8 km) in about 1 hour. Biking 10 miles (16.1 km) or more in about 1 hour. Lap swimming. Roller-skating or in-line skating. Cross-country skiing. Vigorous competitive sports, such as football, basketball, and soccer. Jumping rope. Aerobic dancing. What are some everyday activities that can help me get exercise? Yard work, such as: Child psychotherapist. Raking and bagging leaves. Washing your car. Pushing a stroller. Shoveling snow. Gardening. Washing windows or floors. How can I be more active in my day-to-day activities? Use stairs instead of an elevator. Take a walk during your lunch break. If you drive, park your car farther away from your work or school. If you take public transportation, get off one stop early and walk the rest of the way. Stand up or walk around during all of your indoor phone calls. Get up, stretch, and walk around every 30 minutes throughout the day. Enjoy exercise with a friend. Support to continue exercising will help you keep a regular routine of activity. Where to find more information You can find more information about exercising to stay healthy from: U.S. Department of Health and Human Services: ThisPath.fi Centers for Disease Control and Prevention (CDC): FootballExhibition.com.br Summary Exercising regularly is important. It will improve your overall fitness, flexibility, and endurance. Regular exercise will also improve your overall health. It can help you control your weight, reduce stress, and improve your bone density. Do not exercise so much that you hurt yourself, feel dizzy, or get very short of breath. Before you start a new exercise program,  talk with your health care provider. This information is not intended to replace advice given to you by your health care provider. Make sure you discuss any questions you have with your health care provider. Document Revised: 01/11/2021 Document Reviewed: 01/11/2021 Elsevier Patient Education  2024 [employer].

## 2024-04-28 DIAGNOSIS — I10 Essential (primary) hypertension: Secondary | ICD-10-CM | POA: Diagnosis not present

## 2024-05-03 DIAGNOSIS — M7502 Adhesive capsulitis of left shoulder: Secondary | ICD-10-CM | POA: Diagnosis not present

## 2024-05-04 DIAGNOSIS — M7502 Adhesive capsulitis of left shoulder: Secondary | ICD-10-CM | POA: Diagnosis not present

## 2024-05-11 DIAGNOSIS — M7502 Adhesive capsulitis of left shoulder: Secondary | ICD-10-CM | POA: Diagnosis not present

## 2024-05-15 DIAGNOSIS — U071 COVID-19: Secondary | ICD-10-CM | POA: Diagnosis not present

## 2024-05-15 DIAGNOSIS — R059 Cough, unspecified: Secondary | ICD-10-CM | POA: Diagnosis not present

## 2024-05-15 DIAGNOSIS — R509 Fever, unspecified: Secondary | ICD-10-CM | POA: Diagnosis not present

## 2024-05-20 ENCOUNTER — Other Ambulatory Visit (HOSPITAL_BASED_OUTPATIENT_CLINIC_OR_DEPARTMENT_OTHER): Payer: Self-pay

## 2024-05-20 DIAGNOSIS — I10 Essential (primary) hypertension: Secondary | ICD-10-CM | POA: Diagnosis not present

## 2024-05-20 DIAGNOSIS — E119 Type 2 diabetes mellitus without complications: Secondary | ICD-10-CM | POA: Diagnosis not present

## 2024-05-20 DIAGNOSIS — E7800 Pure hypercholesterolemia, unspecified: Secondary | ICD-10-CM | POA: Diagnosis not present

## 2024-05-20 MED ORDER — OZEMPIC (0.25 OR 0.5 MG/DOSE) 2 MG/3ML ~~LOC~~ SOPN
0.2500 mg | PEN_INJECTOR | SUBCUTANEOUS | 0 refills | Status: AC
  Filled 2024-05-20: qty 3, 28d supply, fill #0

## 2024-05-24 DIAGNOSIS — M7502 Adhesive capsulitis of left shoulder: Secondary | ICD-10-CM | POA: Diagnosis not present

## 2024-05-25 DIAGNOSIS — M7502 Adhesive capsulitis of left shoulder: Secondary | ICD-10-CM | POA: Diagnosis not present

## 2024-06-02 DIAGNOSIS — M7502 Adhesive capsulitis of left shoulder: Secondary | ICD-10-CM | POA: Diagnosis not present

## 2024-06-03 ENCOUNTER — Other Ambulatory Visit: Payer: Self-pay | Admitting: Internal Medicine

## 2024-06-07 ENCOUNTER — Telehealth: Payer: Self-pay

## 2024-06-07 MED ORDER — LOSARTAN POTASSIUM 25 MG PO TABS: 25.0000 mg | ORAL_TABLET | Freq: Every day | ORAL | 3 refills | Status: AC

## 2024-06-07 NOTE — Telephone Encounter (Signed)
*  STAT* If patient is at the pharmacy, call can be transferred to refill team.   1. Which medications need to be refilled? (please list name of each medication and dose if known) losartan  (COZAAR ) 25 MG tablet    2. Would you like to learn more about the convenience, safety, & potential cost savings by using the [facility]?      3. Are you open to using the [facility] (Type [facility].  ).   4. Which pharmacy/location (including street and city if local pharmacy) is medication to be sent to? [id number] - [place], [place] - [address]    5. Do they need a 30 day or 90 day supply? 90 day Patient is out of medication.

## 2024-06-07 NOTE — Telephone Encounter (Signed)
 RX sent in

## 2024-06-10 DIAGNOSIS — M7502 Adhesive capsulitis of left shoulder: Secondary | ICD-10-CM | POA: Diagnosis not present

## 2024-06-14 DIAGNOSIS — M7502 Adhesive capsulitis of left shoulder: Secondary | ICD-10-CM | POA: Diagnosis not present

## 2024-06-16 DIAGNOSIS — M7502 Adhesive capsulitis of left shoulder: Secondary | ICD-10-CM | POA: Diagnosis not present

## 2024-06-20 DIAGNOSIS — M7502 Adhesive capsulitis of left shoulder: Secondary | ICD-10-CM | POA: Diagnosis not present

## 2024-06-24 ENCOUNTER — Emergency Department (HOSPITAL_BASED_OUTPATIENT_CLINIC_OR_DEPARTMENT_OTHER)
Admission: EM | Admit: 2024-06-24 | Discharge: 2024-06-24 | Disposition: A | Discharge status: Home / Self Care | Attending: Emergency Medicine | Admitting: Emergency Medicine

## 2024-06-24 ENCOUNTER — Encounter (HOSPITAL_BASED_OUTPATIENT_CLINIC_OR_DEPARTMENT_OTHER): Payer: Self-pay | Admitting: *Deleted

## 2024-06-24 ENCOUNTER — Emergency Department (HOSPITAL_BASED_OUTPATIENT_CLINIC_OR_DEPARTMENT_OTHER): Admitting: Radiology

## 2024-06-24 ENCOUNTER — Other Ambulatory Visit: Payer: Self-pay

## 2024-06-24 ENCOUNTER — Emergency Department (HOSPITAL_BASED_OUTPATIENT_CLINIC_OR_DEPARTMENT_OTHER)

## 2024-06-24 DIAGNOSIS — Z7984 Long term (current) use of oral hypoglycemic drugs: Secondary | ICD-10-CM | POA: Diagnosis not present

## 2024-06-24 DIAGNOSIS — E1165 Type 2 diabetes mellitus with hyperglycemia: Secondary | ICD-10-CM | POA: Insufficient documentation

## 2024-06-24 DIAGNOSIS — R059 Cough, unspecified: Secondary | ICD-10-CM | POA: Diagnosis not present

## 2024-06-24 DIAGNOSIS — R509 Fever, unspecified: Secondary | ICD-10-CM | POA: Insufficient documentation

## 2024-06-24 DIAGNOSIS — J3489 Other specified disorders of nose and nasal sinuses: Secondary | ICD-10-CM | POA: Diagnosis not present

## 2024-06-24 DIAGNOSIS — M545 Low back pain, unspecified: Secondary | ICD-10-CM | POA: Diagnosis not present

## 2024-06-24 DIAGNOSIS — R9082 White matter disease, unspecified: Secondary | ICD-10-CM | POA: Diagnosis not present

## 2024-06-24 DIAGNOSIS — R519 Headache, unspecified: Secondary | ICD-10-CM | POA: Diagnosis not present

## 2024-06-24 LAB — CBC WITH DIFFERENTIAL/PLATELET
Abs Immature Granulocytes: 0.02 K/uL (ref 0.00–0.07)
Basophils Absolute: 0 K/uL (ref 0.0–0.1)
Basophils Relative: 0 %
Eosinophils Absolute: 0 K/uL (ref 0.0–0.5)
Eosinophils Relative: 0 %
HCT: 38.8 % (ref 36.0–46.0)
Hemoglobin: 12.9 g/dL (ref 12.0–15.0)
Immature Granulocytes: 0 %
Lymphocytes Relative: 18 %
Lymphs Abs: 1.3 K/uL (ref 0.7–4.0)
MCH: 27.7 pg (ref 26.0–34.0)
MCHC: 33.2 g/dL (ref 30.0–36.0)
MCV: 83.3 fL (ref 80.0–100.0)
Monocytes Absolute: 0.4 K/uL (ref 0.1–1.0)
Monocytes Relative: 6 %
Neutro Abs: 5.2 K/uL (ref 1.7–7.7)
Neutrophils Relative %: 76 %
Platelets: 241 K/uL (ref 150–400)
RBC: 4.66 MIL/uL (ref 3.87–5.11)
RDW: 13.6 % (ref 11.5–15.5)
WBC: 7 K/uL (ref 4.0–10.5)
nRBC: 0 % (ref 0.0–0.2)

## 2024-06-24 LAB — URINALYSIS, ROUTINE W REFLEX MICROSCOPIC
Bilirubin Urine: NEGATIVE
Glucose, UA: NEGATIVE mg/dL
Hgb urine dipstick: NEGATIVE
Leukocytes,Ua: NEGATIVE
Nitrite: NEGATIVE
Protein, ur: NEGATIVE mg/dL
Specific Gravity, Urine: 1.024 (ref 1.005–1.030)
pH: 7 (ref 5.0–8.0)

## 2024-06-24 LAB — COMPREHENSIVE METABOLIC PANEL WITH GFR
ALT: 9 U/L (ref 0–44)
AST: 16 U/L (ref 15–41)
Albumin: 4.1 g/dL (ref 3.5–5.0)
Alkaline Phosphatase: 62 U/L (ref 38–126)
Anion gap: 14 (ref 5–15)
BUN: 11 mg/dL (ref 6–20)
CO2: 22 mmol/L (ref 22–32)
Calcium: 8.9 mg/dL (ref 8.9–10.3)
Chloride: 101 mmol/L (ref 98–111)
Creatinine, Ser: 0.98 mg/dL (ref 0.44–1.00)
GFR, Estimated: >60 mL/min (ref >60)
Glucose, Bld: 131 mg/dL — ABNORMAL HIGH (ref 70–99)
Potassium: 4.1 mmol/L (ref 3.5–5.1)
Sodium: 138 mmol/L (ref 135–145)
Total Bilirubin: 0.7 mg/dL (ref 0.0–1.2)
Total Protein: 6.9 g/dL (ref 6.5–8.1)

## 2024-06-24 LAB — RESP PANEL BY RT-PCR (RSV, FLU A&B, COVID)  RVPGX2
Influenza A by PCR: NEGATIVE
Influenza B by PCR: NEGATIVE
Resp Syncytial Virus by PCR: NEGATIVE
SARS Coronavirus 2 by RT PCR: NEGATIVE

## 2024-06-24 LAB — GROUP A STREP BY PCR: Group A Strep by PCR: NOT DETECTED

## 2024-06-24 MED ORDER — IBUPROFEN 800 MG PO TABS
800.0000 mg | ORAL_TABLET | Freq: Once | ORAL | Status: AC
Start: 2024-06-24 — End: 2024-06-24
  Administered 2024-06-24: 800 mg via ORAL
  Filled 2024-06-24: qty 1

## 2024-06-24 MED ORDER — AMOXICILLIN-POT CLAVULANATE 875-125 MG PO TABS: 1.0000 | ORAL_TABLET | Freq: Two times a day (BID) | ORAL | 0 refills | Status: AC

## 2024-06-24 NOTE — ED Provider Notes (Signed)
 [place] EMERGENCY DEPARTMENT AT [facility] Provider Note   CSN: [id number] Arrival date & time: 06/24/24  9087     Patient presents with: Headache   Ann [name] is a 53 y.o. female.  Patient with past history significant for type 2 diabetes, relapsing remitting multiple sclerosis presents to the emergency department today with concerns of a headache.  Reports ongoing headache for the last 2 days with associated fever as well as some low back pain.  She describes the headache as a squeezing type pain.  Denies any cough, congestion, sore throat, nausea, vomiting, abdominal pain, or any urinary symptoms.   Headache      Prior to Admission medications   Medication Sig Start Date End Date Taking? Authorizing Provider  amoxicillin -clavulanate (AUGMENTIN ) 875-125 MG tablet Take 1 tablet by mouth every 12 (twelve) hours for 7 days. 06/24/24 07/01/24 Yes Wilhelmena Zea A, PA-C  MELEYA 0.35 MG tablet Take 1 tablet by mouth daily. 04/20/24  Yes [provider]  ALPRAZolam (XANAX) 0.25 MG tablet Take 0.25 mg by mouth 2 (two) times daily as needed. 06/15/18   [provider]  Cholecalciferol (VITAMIN D3) 10 MCG (400 UNIT) tablet Take 400 Units by mouth daily.    [provider]  fluticasone (FLONASE) 50 MCG/ACT nasal spray fluticasone propionate 50 mcg/actuation nasal spray,suspension    [provider]  loratadine-pseudoephedrine (CLARITIN-D 24 HOUR) 10-240 MG 24 hr tablet Take 1 tablet by mouth as needed.    [provider]  losartan  (COZAAR ) 25 MG tablet Take 1 tablet (25 mg total) by mouth daily. 06/07/24   Acharya, Gayatri A, MD  metFORMIN (GLUCOPHAGE) 500 MG tablet Take 500 mg by mouth 2 (two) times daily with a meal. Patient taking differently: Take 500 mg by mouth daily.    [provider]  norethindrone-ethinyl estradiol-FE (LOESTRIN FE) 1-20 MG-MCG tablet Take 1 tablet by mouth daily.    [provider]   rosuvastatin (CRESTOR) 10 MG tablet Take 10 mg by mouth daily. 04/02/22   [provider]  Semaglutide ,0.25 or 0.5MG /DOS, (OZEMPIC , 0.25 OR 0.5 MG/DOSE,) 2 MG/3ML SOPN Inject 0.25 mg into the skin once a week. 05/20/24     traMADol  (ULTRAM ) 50 MG tablet Take 50 mg by mouth 3 (three) times daily as needed. 04/18/24   [provider]    Allergies: Amoxicillin , Hydrocodone-acetaminophen , and Vicodin [hydrocodone-acetaminophen ]    Review of Systems  Neurological:  Positive for headaches.  All other systems reviewed and are negative.   Updated Vital Signs BP 129/75   Pulse 78   Temp 99.1 F (37.3 C) (Oral)   Resp 18   SpO2 97%   Physical Exam Vitals and nursing note reviewed.  Constitutional:      General: She is not in acute distress.    Appearance: She is well-developed.  HENT:     Head: Normocephalic and atraumatic.  Eyes:     Conjunctiva/sclera: Conjunctivae normal.  Neck:     Meningeal: Brudzinski's sign and Kernig's sign absent.  Cardiovascular:     Rate and Rhythm: Normal rate and regular rhythm.     Heart sounds: No murmur heard. Pulmonary:     Effort: Pulmonary effort is normal. No respiratory distress.     Breath sounds: Normal breath sounds. No wheezing or rales.  Abdominal:     Palpations: Abdomen is soft.     Tenderness: There is no abdominal tenderness.  Musculoskeletal:        General: No swelling.  Cervical back: Neck supple.  Lymphadenopathy:     Cervical: No cervical adenopathy.  Skin:    General: Skin is warm and dry.     Capillary Refill: Capillary refill takes less than 2 seconds.  Neurological:     Mental Status: She is alert.  Psychiatric:        Mood and Affect: Mood normal.     (all labs ordered are listed, but only abnormal results are displayed) Labs Reviewed  COMPREHENSIVE METABOLIC PANEL WITH GFR - Abnormal; Notable for the following components:      Result Value   Glucose, Bld 131 (*)    All other components  within normal limits  URINALYSIS, ROUTINE W REFLEX MICROSCOPIC - Abnormal; Notable for the following components:   Ketones, ur TRACE (*)    Bacteria, UA RARE (*)    All other components within normal limits  RESP PANEL BY RT-PCR (RSV, FLU A&B, COVID)  [id number]  GROUP A STREP BY PCR  CBC WITH DIFFERENTIAL/PLATELET    EKG: None  Radiology: DG Chest 2 View Result Date: 06/24/2024 EXAM: 2 VIEW(S) XRAY OF THE CHEST 06/24/2024 10:43:34 AM COMPARISON: PA and lateral radiographs of the chest dated 06/23/2007 CLINICAL HISTORY: Cough FINDINGS: LUNGS AND PLEURA: No focal pulmonary opacity. No pulmonary edema. No pleural effusion. No pneumothorax. HEART AND MEDIASTINUM: No acute abnormality of the cardiac and mediastinal silhouettes. BONES AND SOFT TISSUES: No acute osseous abnormality. IMPRESSION: 1. Normal chest radiograph. No acute cardiopulmonary abnormality. Electronically signed by: Evalene Coho MD 06/24/2024 10:50 AM EDT RP Workstation: [id number]   CT Head Wo Contrast Result Date: 06/24/2024 EXAM: CT HEAD WITHOUT CONTRAST 06/24/2024 10:43:20 AM TECHNIQUE: CT of the head was performed without the administration of intravenous contrast. Automated exposure control, iterative reconstruction, and/or weight based adjustment of the mA/kV was utilized to reduce the radiation dose to as low as reasonably achievable. COMPARISON: MRI of the head dated 09/17/19. CLINICAL HISTORY: Meningitis/CNS infection suspected. FINDINGS: BRAIN AND VENTRICLES: No acute hemorrhage. No evidence of acute infarct. No hydrocephalus. No extra-axial collection. No mass effect or midline shift. There is mild-to-moderate periventricular white matter disease. ORBITS: No acute abnormality. SINUSES: No acute abnormality. SOFT TISSUES AND SKULL: No acute soft tissue abnormality. No skull fracture. IMPRESSION: 1. No acute intracranial abnormality. 2. Chronic mild-to-moderate periventricular white matter disease. Electronically signed by:  Evalene Coho MD 06/24/2024 10:50 AM EDT RP Workstation: [id number]     Procedures   Medications Ordered in the ED  ibuprofen  (ADVIL ) tablet 800 mg (800 mg Oral Given 06/24/24 0934)                                    Medical Decision Making Amount and/or Complexity of Data Reviewed Labs: ordered. Radiology: ordered.  Risk Prescription drug management.   This patient presents to the ED for concern of headache, this involves an extensive number of treatment options, and is a complaint that carries with it a high risk of complications and morbidity.  The differential diagnosis includes sinusitis, conjunctivitis, viral URI, meningitis, UTI   Co morbidities that complicate the patient evaluation  Type 2 diabetes, relapsing remitting multiple sclerosis   Lab Tests:  I Ordered, and personally interpreted labs.  The pertinent results include: Respiratory panel negative, CBC unremarkable, CMP mild hyperglycemia but otherwise unremarkable, group A strep negative, UA negative   Imaging Studies ordered:  I ordered imaging studies including chest x-ray, CT head I  independently visualized and interpreted imaging which showed chest x-ray and CT head negative for any acute findings I agree with the radiologist interpretation   Consultations Obtained:  I requested consultation with none,  and discussed lab and imaging findings as well as pertinent plan - they recommend: N/AA   Problem List / ED Course / Critical interventions / Medication management  Patient with history of type 2 diabetes, relapsing remitting multiple sclerosis presents to the emergency department with concerns of a headache.  States he has been having ongoing headache that she describes as tension type around the front of her head for the last 2 days.  Has had some associated lower body aches with this.  Also endorses fevers over this time but denies any sick contacts that she mostly works from home.  Reports  being up-to-date on immunizations. On exam, patient has no abnormal heart or lung sounds.  No abdominal tenderness.  Kernig's, Brudzinski's, and nuchal rigidity all negative.  Doubtful of meningitis. Patient's workup is reassuring with no evident leukocytosis, no obvious source of infection with negative strep and negative respiratory panel.  Chest x-ray and CT are also negative.  Unclear cause of patient's symptoms.  Based on her exam with some tenderness primarily towards the frontal sinuses, unclear if this may be possible sinus infection causing her headache and fever.  She denies any significant nasal discharge or drainage and no reported cough. Given unclear cause of patient's fever and her sinus pressure, will start patient on course of antibiotics treating as if this were sinusitis.  Advise strict return precautions such as worsening fever, headache, or no improvement over the next 1 to 2 days with antibiotics.  Again, with no obvious meningeal findings, doubtful of meningitis although difficult to explain her current symptoms.  Deferred lumbar puncture after careful discussion with patient.  Patient verbalized understanding agreement with current return precautions and will plan on following up closely with PCP if symptoms are progressively improving.  Discharged home in stable condition. I ordered medication including ibuprofen  for fever, headache Reevaluation of the patient after these medicines showed that the patient improved I have reviewed the patients home medicines and have made adjustments as needed   Social Determinants of Health:  None   Test / Admission - Considered:  Considered but stable for outpatient follow-up.  Final diagnoses:  Sinus pressure  Fever, unspecified fever cause    ED Discharge Orders          Ordered    amoxicillin -clavulanate (AUGMENTIN ) 875-125 MG tablet  Every 12 hours        06/24/24 1345               Cecily Legrand LABOR, PA-C 06/24/24  1445    Armenta Canning, MD 06/30/24 1614

## 2024-06-24 NOTE — ED Notes (Signed)
 Pt d/c instructions, medications, and follow-up care reviewed with pt. Pt verbalized understanding and had no further questions at time of d/c. Pt CA&Ox4, ambulatory, and in NAD at time of d/c

## 2024-06-24 NOTE — ED Triage Notes (Signed)
 Pt is here for evaluation of headache x2 days.  Pt has had some lower body aches with this.  Pt describes the HA as a a squeezing pain, neck supple.

## 2024-06-24 NOTE — Discharge Instructions (Signed)
 You were seen in the ER today for concerns of a fever and sinus type headache. Your labs and imaging were all thankfully reassuring. I would suggest starting a course of antibiotics to help address a potential underlying sinus infection. Please take this as prescribed. For any concerns of new or worsening symptoms, please return to the ER. Otherwise, please follow up with your primary care provider.

## 2024-07-01 DIAGNOSIS — M7502 Adhesive capsulitis of left shoulder: Secondary | ICD-10-CM | POA: Diagnosis not present

## 2024-07-06 DIAGNOSIS — M7502 Adhesive capsulitis of left shoulder: Secondary | ICD-10-CM | POA: Diagnosis not present

## 2024-07-12 DIAGNOSIS — M7502 Adhesive capsulitis of left shoulder: Secondary | ICD-10-CM | POA: Diagnosis not present

## 2024-07-20 DIAGNOSIS — M19012 Primary osteoarthritis, left shoulder: Secondary | ICD-10-CM | POA: Diagnosis not present

## 2024-07-20 DIAGNOSIS — M7502 Adhesive capsulitis of left shoulder: Secondary | ICD-10-CM | POA: Diagnosis not present

## 2024-07-20 DIAGNOSIS — M7522 Bicipital tendinitis, left shoulder: Secondary | ICD-10-CM | POA: Diagnosis not present

## 2024-07-22 DIAGNOSIS — I10 Essential (primary) hypertension: Secondary | ICD-10-CM | POA: Diagnosis not present

## 2024-07-22 DIAGNOSIS — E119 Type 2 diabetes mellitus without complications: Secondary | ICD-10-CM | POA: Diagnosis not present

## 2024-07-22 DIAGNOSIS — Z23 Encounter for immunization: Secondary | ICD-10-CM | POA: Diagnosis not present

## 2024-08-17 DIAGNOSIS — M25512 Pain in left shoulder: Secondary | ICD-10-CM | POA: Diagnosis not present

## 2024-08-17 DIAGNOSIS — M7502 Adhesive capsulitis of left shoulder: Secondary | ICD-10-CM | POA: Diagnosis not present

## 2024-08-23 DIAGNOSIS — M25512 Pain in left shoulder: Secondary | ICD-10-CM | POA: Diagnosis not present

## 2024-08-23 DIAGNOSIS — Z1231 Encounter for screening mammogram for malignant neoplasm of breast: Secondary | ICD-10-CM | POA: Diagnosis not present

## 2024-08-23 DIAGNOSIS — M7502 Adhesive capsulitis of left shoulder: Secondary | ICD-10-CM | POA: Diagnosis not present

## 2024-08-23 DIAGNOSIS — Z01419 Encounter for gynecological examination (general) (routine) without abnormal findings: Secondary | ICD-10-CM | POA: Diagnosis not present

## 2024-08-30 DIAGNOSIS — M7502 Adhesive capsulitis of left shoulder: Secondary | ICD-10-CM | POA: Diagnosis not present

## 2024-08-30 DIAGNOSIS — M25512 Pain in left shoulder: Secondary | ICD-10-CM | POA: Diagnosis not present

## 2024-09-07 ENCOUNTER — Emergency Department (HOSPITAL_BASED_OUTPATIENT_CLINIC_OR_DEPARTMENT_OTHER)
Admission: EM | Admit: 2024-09-07 | Discharge: 2024-09-07 | Disposition: A | Discharge status: Home / Self Care | Source: Ambulatory Visit | Attending: Emergency Medicine | Admitting: Emergency Medicine

## 2024-09-07 ENCOUNTER — Encounter (HOSPITAL_BASED_OUTPATIENT_CLINIC_OR_DEPARTMENT_OTHER): Payer: Self-pay | Admitting: Emergency Medicine

## 2024-09-07 ENCOUNTER — Other Ambulatory Visit: Payer: Self-pay

## 2024-09-07 DIAGNOSIS — R21 Rash and other nonspecific skin eruption: Secondary | ICD-10-CM | POA: Diagnosis present

## 2024-09-07 DIAGNOSIS — Z7984 Long term (current) use of oral hypoglycemic drugs: Secondary | ICD-10-CM | POA: Diagnosis not present

## 2024-09-07 DIAGNOSIS — R519 Headache, unspecified: Secondary | ICD-10-CM | POA: Insufficient documentation

## 2024-09-07 DIAGNOSIS — E119 Type 2 diabetes mellitus without complications: Secondary | ICD-10-CM | POA: Diagnosis not present

## 2024-09-07 LAB — GROUP A STREP BY PCR: Group A Strep by PCR: NOT DETECTED

## 2024-09-07 LAB — COMPREHENSIVE METABOLIC PANEL WITH GFR
ALT: 26 U/L (ref 0–44)
AST: 26 U/L (ref 15–41)
Albumin: 3.9 g/dL (ref 3.5–5.0)
Alkaline Phosphatase: 77 U/L (ref 38–126)
Anion gap: 13 (ref 5–15)
BUN: 11 mg/dL (ref 6–20)
CO2: 25 mmol/L (ref 22–32)
Calcium: 9.2 mg/dL (ref 8.9–10.3)
Chloride: 99 mmol/L (ref 98–111)
Creatinine, Ser: 1 mg/dL (ref 0.44–1.00)
GFR, Estimated: >60 mL/min (ref >60)
Glucose, Bld: 153 mg/dL — ABNORMAL HIGH (ref 70–99)
Potassium: 3.9 mmol/L (ref 3.5–5.1)
Sodium: 136 mmol/L (ref 135–145)
Total Bilirubin: 1 mg/dL (ref 0.0–1.2)
Total Protein: 7.7 g/dL (ref 6.5–8.1)

## 2024-09-07 LAB — CBC
HCT: 42.3 % (ref 36.0–46.0)
Hemoglobin: 14.1 g/dL (ref 12.0–15.0)
MCH: 27.4 pg (ref 26.0–34.0)
MCHC: 33.3 g/dL (ref 30.0–36.0)
MCV: 82.1 fL (ref 80.0–100.0)
Platelets: 181 K/uL (ref 150–400)
RBC: 5.15 MIL/uL — ABNORMAL HIGH (ref 3.87–5.11)
RDW: 13.3 % (ref 11.5–15.5)
WBC: 4.5 K/uL (ref 4.0–10.5)
nRBC: 0 % (ref 0.0–0.2)

## 2024-09-07 LAB — URINALYSIS, ROUTINE W REFLEX MICROSCOPIC
Bilirubin Urine: NEGATIVE
Glucose, UA: NEGATIVE mg/dL
Hgb urine dipstick: NEGATIVE
Leukocytes,Ua: NEGATIVE
Nitrite: NEGATIVE
Specific Gravity, Urine: 1.019 (ref 1.005–1.030)
pH: 5.5 (ref 5.0–8.0)

## 2024-09-07 MED ORDER — DOXYCYCLINE HYCLATE 100 MG PO TABS
100.0000 mg | ORAL_TABLET | Freq: Once | ORAL | Status: AC
  Administered 2024-09-07: 100 mg via ORAL
  Filled 2024-09-07: qty 1

## 2024-09-07 MED ORDER — DOXYCYCLINE HYCLATE 100 MG PO CAPS: 100.0000 mg | ORAL_CAPSULE | Freq: Two times a day (BID) | ORAL | 0 refills | Status: AC

## 2024-09-07 NOTE — Discharge Instructions (Signed)
 Your lab work appears normal.  Normal infection fighting cell count, hemoglobin, platelet count.  Your liver and kidney function were normal.  Testing for Lyme disease, [place] spotted fever has been sent and pending.  Please follow-up with your doctor for these results.  We will give you antibiotic in the meantime to treat these infections.  Return to the emergency department if you have high fever, persistent vomiting, worsening pains or rash, any bleeding.

## 2024-09-07 NOTE — ED Triage Notes (Signed)
 Pt referred by pcp for testing for rash on BUE and BLE today. Also reports joint pain in hands and neck, shoulder and knees and feet. Reports fever last night

## 2024-09-07 NOTE — ED Provider Notes (Signed)
 Signout from [place] PA-C at shift change. Briefly, patient presents for rash.  Involves her arms and legs.  She has had some joint pains.  Reports fever yesterday.  Sent by PCP for testing.   Plan: f/u strep -- home with doxy, outpatient follow-up.     11:04 PM Reassessment performed. Patient appears stable, comfortable.  She has a petechial rash on the bilateral lower extremities, a more diffuse rash on the arms.   Labs and imaging personally reviewed and interpreted including: CBC shows normal white blood cell count and hemoglobin, normal platelets at 181; CMP with glucose 153, normal liver function; urine test without signs of bleeding; strep test returned negative.  Lyme and spotted fever testing pending.   Will give first dose of doxycycline .   Most current vital signs reviewed and are as follows: BP 128/83   Pulse 94   Temp 98.3 F (36.8 C)   Resp 20   Wt 125.2 kg   SpO2 100%   BMI 46.64 kg/m   Plan: Close PCP follow-up   Home treatment: Doxycycline    Return and follow-up instructions: Encouraged return to ED with high fever, joint swelling, new or worsening symptoms.. Encouraged patient to follow-up with their provider in 3 days. Patient verbalized understanding and agreed with plan.      Desiderio Chew, PA-C 09/07/24 2306    Emil Share, DO 09/07/24 2332

## 2024-09-07 NOTE — ED Provider Notes (Signed)
 [place] EMERGENCY DEPARTMENT AT [facility] Provider Note   CSN: [id number] Arrival date & time: 09/07/24  8195     Patient presents with: Rash   Ann [name] is a 53 y.o. female.   Patient complains of a rash that is worse on her arms and her legs.  Patient saw her primary care physician today and was encouraged to come to the emergency department for evaluation.  Primary care was concerned that patient needed testing for [facility] spotted fever and tick related illnesses.  Patient reports that she has had gradually progressing rash over the past 2 days.  Patient reports that she had a temperature of 102 yesterday.  Patient denies any tick bites.  She reports that she does not spend any time outside.  Patient complains of a headache.  Patient has a past medical history of diabetes multiple sclerosis  The history is provided by the patient. No language interpreter was used.  Rash      Prior to Admission medications   Medication Sig Start Date End Date Taking? Authorizing Provider  ALPRAZolam (XANAX) 0.25 MG tablet Take 0.25 mg by mouth 2 (two) times daily as needed. 06/15/18   [provider]  Cholecalciferol (VITAMIN D3) 10 MCG (400 UNIT) tablet Take 400 Units by mouth daily.    [provider]  fluticasone (FLONASE) 50 MCG/ACT nasal spray fluticasone propionate 50 mcg/actuation nasal spray,suspension    [provider]  loratadine-pseudoephedrine (CLARITIN-D 24 HOUR) 10-240 MG 24 hr tablet Take 1 tablet by mouth as needed.    [provider]  losartan  (COZAAR ) 25 MG tablet Take 1 tablet (25 mg total) by mouth daily. 06/07/24   Acharya, Gayatri A, MD  MELEYA 0.35 MG tablet Take 1 tablet by mouth daily. 04/20/24   [provider]  metFORMIN (GLUCOPHAGE) 500 MG tablet Take 500 mg by mouth 2 (two) times daily with a meal. Patient taking differently: Take 500 mg by mouth daily.    [provider]  norethindrone-ethinyl  estradiol-FE (LOESTRIN FE) 1-20 MG-MCG tablet Take 1 tablet by mouth daily.    [provider]  rosuvastatin (CRESTOR) 10 MG tablet Take 10 mg by mouth daily. 04/02/22   [provider]  Semaglutide ,0.25 or 0.5MG /DOS, (OZEMPIC , 0.25 OR 0.5 MG/DOSE,) 2 MG/3ML SOPN Inject 0.25 mg into the skin once a week. 05/20/24     traMADol  (ULTRAM ) 50 MG tablet Take 50 mg by mouth 3 (three) times daily as needed. 04/18/24   [provider]    Allergies: Amoxicillin , Hydrocodone-acetaminophen , and Vicodin [hydrocodone-acetaminophen ]    Review of Systems  Skin:  Positive for rash.    Updated Vital Signs BP 128/83   Pulse 94   Temp 98.3 F (36.8 C)   Resp 20   Wt 125.2 kg   SpO2 100%   BMI 46.64 kg/m   Physical Exam Vitals reviewed.  HENT:     Head: Normocephalic.     Nose: Nose normal.     Mouth/Throat:     Mouth: Mucous membranes are moist.     Comments: No oral lesions Cardiovascular:     Rate and Rhythm: Normal rate.  Pulmonary:     Effort: Pulmonary effort is normal.  Musculoskeletal:        General: Normal range of motion.  Skin:    Findings: Erythema and rash present.     Comments: Flat red rash most prominent on arms and legs, palms and soles of feet are spared, no rash on  face or chest.  Neurological:     General: No focal deficit present.     Mental Status: She is alert.     (all labs ordered are listed, but only abnormal results are displayed) Labs Reviewed  COMPREHENSIVE METABOLIC PANEL WITH GFR - Abnormal; Notable for the following components:      Result Value   Glucose, Bld 153 (*)    All other components within normal limits  CBC - Abnormal; Notable for the following components:   RBC 5.15 (*)    All other components within normal limits  URINALYSIS, ROUTINE W REFLEX MICROSCOPIC - Abnormal; Notable for the following components:   Ketones, ur TRACE (*)    Protein, ur TRACE (*)    All other components within normal limits  GROUP A STREP  BY PCR  ROCKY MTN SPOTTED FVR ABS PNL(IGG+IGM)  LYME DISEASE SEROLOGY W/REFLEX  SPOTTED FEVER GROUP ANTIBODIES    EKG: None  Radiology: No results found.   Procedures   Medications Ordered in the ED - No data to display                                  Medical Decision Making Patient saw her primary care physician today for a rash.  Patient reports that she has had a fever.  Patient complains of some bodyaches and a headache.  Patient has not had any known tick bites.  Amount and/or Complexity of Data Reviewed Labs: ordered. Decision-making details documented in ED Course.    Details: Labs ordered reviewed and interpreted  Risk Risk Details: Pt's care turned over to [facility].         Final diagnoses:  Rash    ED Discharge Orders     None          Keymarion Bearman K, PA-C 09/07/24 2217    Emil Share, DO 09/07/24 2332

## 2024-09-09 LAB — SPOTTED FEVER GROUP ANTIBODIES
Spotted Fever Group IgG: <1:64 {titer}
Spotted Fever Group IgM: <1:64 {titer}

## 2024-09-09 LAB — LYME DISEASE SEROLOGY W/REFLEX: Lyme Total Antibody EIA: NEGATIVE
